# Patient Record
Sex: Male | Born: 1945 | Race: White | Hispanic: No | Marital: Married | Smoking: Never smoker
Health system: Southern US, Community
[De-identification: ages and names within clinical notes are randomized; demographics above are authoritative.]

## PROBLEM LIST (undated history)

## (undated) HISTORY — PX: FRACTURE SURGERY: SHX138

## (undated) HISTORY — PX: KNEE SURGERY: SHX244

## (undated) HISTORY — PX: HIP SURGERY: SHX245

## (undated) HISTORY — PX: NASAL SINUS SURGERY: SHX719

## (undated) HISTORY — PX: APPENDECTOMY: SHX54

## (undated) HISTORY — PX: TONSILLECTOMY: SUR1361

---

## 1979-07-25 HISTORY — PX: CARPAL TUNNEL RELEASE: SHX101

## 1999-09-27 ENCOUNTER — Ambulatory Visit (HOSPITAL_COMMUNITY): Admission: RE | Admit: 1999-09-27 | Discharge: 1999-09-27 | Payer: Self-pay | Admitting: Gastroenterology

## 2008-08-26 ENCOUNTER — Encounter: Admission: RE | Admit: 2008-08-26 | Discharge: 2008-08-26 | Payer: Self-pay | Admitting: Internal Medicine

## 2012-02-08 HISTORY — PX: HIP SURGERY: SHX245

## 2014-03-06 DIAGNOSIS — M5481 Occipital neuralgia: Secondary | ICD-10-CM

## 2014-03-06 DIAGNOSIS — M47812 Spondylosis without myelopathy or radiculopathy, cervical region: Secondary | ICD-10-CM | POA: Insufficient documentation

## 2014-03-06 HISTORY — DX: Spondylosis without myelopathy or radiculopathy, cervical region: M47.812

## 2014-03-06 HISTORY — DX: Occipital neuralgia: M54.81

## 2014-03-12 DIAGNOSIS — M25511 Pain in right shoulder: Secondary | ICD-10-CM

## 2014-03-12 HISTORY — DX: Pain in right shoulder: M25.511

## 2014-10-06 DIAGNOSIS — M2041 Other hammer toe(s) (acquired), right foot: Secondary | ICD-10-CM

## 2014-10-06 DIAGNOSIS — G8929 Other chronic pain: Secondary | ICD-10-CM | POA: Insufficient documentation

## 2014-10-06 DIAGNOSIS — M79671 Pain in right foot: Secondary | ICD-10-CM | POA: Diagnosis not present

## 2014-10-06 HISTORY — DX: Other hammer toe(s) (acquired), right foot: M20.41

## 2014-10-06 HISTORY — DX: Other chronic pain: G89.29

## 2014-11-07 DIAGNOSIS — I10 Essential (primary) hypertension: Secondary | ICD-10-CM | POA: Diagnosis not present

## 2014-11-07 DIAGNOSIS — Z1389 Encounter for screening for other disorder: Secondary | ICD-10-CM | POA: Diagnosis not present

## 2014-11-07 DIAGNOSIS — E785 Hyperlipidemia, unspecified: Secondary | ICD-10-CM | POA: Diagnosis not present

## 2014-11-07 DIAGNOSIS — Z9181 History of falling: Secondary | ICD-10-CM | POA: Diagnosis not present

## 2014-11-07 DIAGNOSIS — Z23 Encounter for immunization: Secondary | ICD-10-CM | POA: Diagnosis not present

## 2014-11-07 DIAGNOSIS — E559 Vitamin D deficiency, unspecified: Secondary | ICD-10-CM | POA: Diagnosis not present

## 2014-11-07 DIAGNOSIS — Z0000 Encounter for general adult medical examination without abnormal findings: Secondary | ICD-10-CM | POA: Diagnosis not present

## 2014-12-01 DIAGNOSIS — D122 Benign neoplasm of ascending colon: Secondary | ICD-10-CM | POA: Diagnosis not present

## 2014-12-01 DIAGNOSIS — K5730 Diverticulosis of large intestine without perforation or abscess without bleeding: Secondary | ICD-10-CM | POA: Diagnosis not present

## 2014-12-01 DIAGNOSIS — Z09 Encounter for follow-up examination after completed treatment for conditions other than malignant neoplasm: Secondary | ICD-10-CM | POA: Diagnosis not present

## 2014-12-01 DIAGNOSIS — Z86010 Personal history of colonic polyps: Secondary | ICD-10-CM | POA: Diagnosis not present

## 2014-12-01 DIAGNOSIS — D123 Benign neoplasm of transverse colon: Secondary | ICD-10-CM | POA: Diagnosis not present

## 2014-12-01 DIAGNOSIS — D126 Benign neoplasm of colon, unspecified: Secondary | ICD-10-CM | POA: Diagnosis not present

## 2015-01-19 DIAGNOSIS — M25559 Pain in unspecified hip: Secondary | ICD-10-CM | POA: Diagnosis not present

## 2015-01-19 DIAGNOSIS — S7002XA Contusion of left hip, initial encounter: Secondary | ICD-10-CM | POA: Diagnosis not present

## 2015-01-19 DIAGNOSIS — M25551 Pain in right hip: Secondary | ICD-10-CM

## 2015-01-19 HISTORY — DX: Pain in right hip: M25.551

## 2015-05-29 DIAGNOSIS — I10 Essential (primary) hypertension: Secondary | ICD-10-CM | POA: Diagnosis not present

## 2015-05-29 DIAGNOSIS — E559 Vitamin D deficiency, unspecified: Secondary | ICD-10-CM | POA: Diagnosis not present

## 2015-05-29 DIAGNOSIS — K219 Gastro-esophageal reflux disease without esophagitis: Secondary | ICD-10-CM | POA: Diagnosis not present

## 2015-05-29 DIAGNOSIS — Z125 Encounter for screening for malignant neoplasm of prostate: Secondary | ICD-10-CM | POA: Diagnosis not present

## 2015-05-29 DIAGNOSIS — Z23 Encounter for immunization: Secondary | ICD-10-CM | POA: Diagnosis not present

## 2015-05-29 DIAGNOSIS — E785 Hyperlipidemia, unspecified: Secondary | ICD-10-CM | POA: Diagnosis not present

## 2015-06-18 DIAGNOSIS — Z6834 Body mass index (BMI) 34.0-34.9, adult: Secondary | ICD-10-CM | POA: Diagnosis not present

## 2015-06-18 DIAGNOSIS — J019 Acute sinusitis, unspecified: Secondary | ICD-10-CM | POA: Diagnosis not present

## 2015-06-28 DIAGNOSIS — M5136 Other intervertebral disc degeneration, lumbar region: Secondary | ICD-10-CM | POA: Diagnosis not present

## 2015-06-28 DIAGNOSIS — M545 Low back pain: Secondary | ICD-10-CM | POA: Diagnosis not present

## 2015-06-28 DIAGNOSIS — M47816 Spondylosis without myelopathy or radiculopathy, lumbar region: Secondary | ICD-10-CM | POA: Diagnosis not present

## 2015-06-28 DIAGNOSIS — Z6834 Body mass index (BMI) 34.0-34.9, adult: Secondary | ICD-10-CM | POA: Diagnosis not present

## 2015-07-06 DIAGNOSIS — Z6834 Body mass index (BMI) 34.0-34.9, adult: Secondary | ICD-10-CM | POA: Diagnosis not present

## 2015-07-06 DIAGNOSIS — M545 Low back pain: Secondary | ICD-10-CM | POA: Diagnosis not present

## 2015-07-06 DIAGNOSIS — M25551 Pain in right hip: Secondary | ICD-10-CM | POA: Diagnosis not present

## 2015-07-15 ENCOUNTER — Encounter (HOSPITAL_COMMUNITY): Payer: Self-pay

## 2015-07-15 ENCOUNTER — Emergency Department (HOSPITAL_COMMUNITY): Payer: Non-veteran care

## 2015-07-15 ENCOUNTER — Emergency Department (HOSPITAL_COMMUNITY)
Admission: EM | Admit: 2015-07-15 | Discharge: 2015-07-15 | Disposition: A | Discharge status: Home / Self Care | Payer: Non-veteran care | Attending: Emergency Medicine | Admitting: Emergency Medicine

## 2015-07-15 DIAGNOSIS — Y9339 Activity, other involving climbing, rappelling and jumping off: Secondary | ICD-10-CM | POA: Insufficient documentation

## 2015-07-15 DIAGNOSIS — S4992XA Unspecified injury of left shoulder and upper arm, initial encounter: Secondary | ICD-10-CM | POA: Diagnosis present

## 2015-07-15 DIAGNOSIS — Y998 Other external cause status: Secondary | ICD-10-CM | POA: Diagnosis not present

## 2015-07-15 DIAGNOSIS — Y9289 Other specified places as the place of occurrence of the external cause: Secondary | ICD-10-CM | POA: Diagnosis not present

## 2015-07-15 DIAGNOSIS — W11XXXA Fall on and from ladder, initial encounter: Secondary | ICD-10-CM | POA: Diagnosis not present

## 2015-07-15 DIAGNOSIS — M79602 Pain in left arm: Secondary | ICD-10-CM | POA: Diagnosis not present

## 2015-07-15 DIAGNOSIS — S42392A Other fracture of shaft of left humerus, initial encounter for closed fracture: Secondary | ICD-10-CM | POA: Insufficient documentation

## 2015-07-15 DIAGNOSIS — S42302A Unspecified fracture of shaft of humerus, left arm, initial encounter for closed fracture: Secondary | ICD-10-CM

## 2015-07-15 DIAGNOSIS — S42322A Displaced transverse fracture of shaft of humerus, left arm, initial encounter for closed fracture: Secondary | ICD-10-CM | POA: Diagnosis not present

## 2015-07-15 DIAGNOSIS — T148 Other injury of unspecified body region: Secondary | ICD-10-CM | POA: Diagnosis not present

## 2015-07-15 MED ORDER — OXYCODONE-ACETAMINOPHEN 5-325 MG PO TABS: 1.0000 | ORAL_TABLET | ORAL | Status: DC | PRN

## 2015-07-15 MED ORDER — OXYCODONE-ACETAMINOPHEN 5-325 MG PO TABS
2.0000 | ORAL_TABLET | Freq: Once | ORAL | Status: AC
  Administered 2015-07-15: 2 via ORAL
  Filled 2015-07-15: qty 2

## 2015-07-15 MED ORDER — HYDROMORPHONE HCL 1 MG/ML IJ SOLN
1.0000 mg | Freq: Once | INTRAMUSCULAR | Status: AC
  Administered 2015-07-15: 1 mg via INTRAVENOUS
  Filled 2015-07-15: qty 1

## 2015-07-15 MED ORDER — FENTANYL CITRATE (PF) 100 MCG/2ML IJ SOLN
100.0000 ug | Freq: Once | INTRAMUSCULAR | Status: AC
  Administered 2015-07-15: 100 ug via INTRAVENOUS
  Filled 2015-07-15: qty 2

## 2015-07-15 NOTE — ED Provider Notes (Signed)
CSN: [id number]     Arrival date & time 07/15/15  1431 History   First MD Initiated Contact with Patient 07/15/15 1457     Chief Complaint  Patient presents with  . Arm Injury     (Consider location/radiation/quality/duration/timing/severity/associated sxs/prior Treatment) HPI  69 year old male presents after a fall off a ladder. He was estimated 8 feet high on the ladder when he was climbing down a ladder tipped over and he fell and landed on his left side. Did not hit his head and has no neck pain. Patient has left upper arm pain and feels numbness over the site of injury. He states he can feel his fingers and move his fingers. Rates his pain as severe.  History reviewed. No pertinent past medical history. Past Surgical History  Procedure Laterality Date  . Hip surgery     History reviewed. No pertinent family history. Social History  Substance Use Topics  . Smoking status: Never Smoker   . Smokeless tobacco: None  . Alcohol Use: Yes     Comment: socially    Review of Systems  Gastrointestinal: Negative for vomiting.  Musculoskeletal: Positive for arthralgias. Negative for neck pain.  Neurological: Positive for numbness. Negative for dizziness, weakness and headaches.  All other systems reviewed and are negative.     Allergies  Review of patient's allergies indicates no known allergies.  Home Medications   Prior to Admission medications   Not on File   BP 147/79 mmHg  Pulse 72  Temp(Src) 97.9 F (36.6 C) (Oral)  Resp 18  Ht 6' (1.829 m)  Wt 250 lb (113.399 kg)  BMI 33.90 kg/m2  SpO2 95% Physical Exam  Constitutional: He is oriented to person, place, and time. He appears well-developed and well-nourished.  HENT:  Head: Normocephalic and atraumatic.  Right Ear: External ear normal.  Left Ear: External ear normal.  Nose: Nose normal.  Eyes: Right eye exhibits no discharge. Left eye exhibits no discharge.  Neck: Neck supple.  Cardiovascular: Normal rate,  regular rhythm, normal heart sounds and intact distal pulses.   Pulses:      Radial pulses are 2+ on the right side, and 2+ on the left side.  Pulmonary/Chest: Effort normal and breath sounds normal.  Abdominal: Soft. He exhibits no distension. There is no tenderness.  Musculoskeletal: He exhibits no edema.       Left shoulder: He exhibits no tenderness.       Left elbow: No tenderness found.       Left upper arm: He exhibits tenderness, swelling and deformity.  Neurological: He is alert and oriented to person, place, and time.  Skin: Skin is warm and dry.  Nursing note and vitals reviewed.   ED Course  Procedures (including critical care time) Labs Review Labs Reviewed - No data to display  Imaging Review Dg Shoulder Left  07/15/2015  CLINICAL DATA:  Left humeral deformity status post fall from a ladder. EXAM: LEFT SHOULDER - 2+ VIEW COMPARISON:  None. FINDINGS: There is a transverse fracture through the proximal 1/3 of the left humerus with 17 mm overlap of the fracture fragments, and 1/2 shaft medial displacement of the distal fracture fragment. There is an associated soft tissue swelling. The left glenohumeral joint appears located. The visualized portions of the lung are normal. IMPRESSION: Transverse fracture of the proximal 1/3 of the left humeral shaft. Electronically Signed   By: Fidela Salisbury M.D.   On: 07/15/2015 16:03   Dg Humerus Left  07/15/2015  CLINICAL DATA:  [place] from ladder approximately 12 feet. Left arm pain and deformity. EXAM: LEFT HUMERUS - 2+ VIEW COMPARISON:  None. FINDINGS: There is a transverse midshaft fracture of the left humerus. Fractures non comminuted. Fracture is displaced, with the distal fracture component displaced lateral to the proximal fracture component by 1 full shaft width. Fracture is also overlapped by approximate 2.5 cm. No other fractures. Shoulder and elbow joints appear normally aligned. IMPRESSION: Displaced, non comminuted fracture  of the midshaft of the left humerus. Electronically Signed   By: Lajean Manes M.D.   On: 07/15/2015 16:01   I have personally reviewed and evaluated these images and lab results as part of my medical decision-making.   EKG Interpretation None      MDM   Final diagnoses:  Humerus shaft fracture, left, closed, initial encounter    Patient is neurovascularly intact. He asked he feels better after the x-ray, it appears that his deformity is less pronounced and likely during the x-rays his arm was pulled down a little. Remains neurovascularly intact. Pain better controlled with IV pain medicine. He feels comfortable going home and will be given oral Percocet. I discussed with Dr. Rush Farmer of orthopedic surgery who recommends a coaptation splint and sling. Patient is to call the office next morning for close follow-up.    Sherwood Gambler, MD 07/16/15 [phone]

## 2015-07-15 NOTE — ED Notes (Signed)
Pt brought in EMS after a fall from an 52ft ladder.  Pt denies LOC, neck or back pain.  Pt reports he landed on his left arm and has obvious deformity to the left upper arm.  Pt is A&Ox4 upon arrival.

## 2015-07-15 NOTE — Progress Notes (Signed)
Orthopedic Tech Progress Note Patient Details:  IMMANUEL LANGRIDGE Oct 21, 1945 [id number]  Ortho Devices Type of Ortho Device: Ace wrap, Arm sling, Coapt, Post (long arm) splint Ortho Device/Splint Location: LUE Ortho Device/Splint Interventions: Ordered, Application   Braulio Bosch 07/15/2015, 5:46 PM

## 2015-07-15 NOTE — ED Notes (Signed)
EDP at bedside  

## 2015-07-20 DIAGNOSIS — M25512 Pain in left shoulder: Secondary | ICD-10-CM

## 2015-07-20 HISTORY — DX: Pain in left shoulder: M25.512

## 2015-07-24 DIAGNOSIS — M898X2 Other specified disorders of bone, upper arm: Secondary | ICD-10-CM

## 2015-07-24 DIAGNOSIS — M79622 Pain in left upper arm: Secondary | ICD-10-CM | POA: Diagnosis not present

## 2015-07-24 HISTORY — DX: Other specified disorders of bone, upper arm: M89.8X2

## 2015-08-05 DIAGNOSIS — M79622 Pain in left upper arm: Secondary | ICD-10-CM | POA: Diagnosis not present

## 2015-08-06 DIAGNOSIS — S42302A Unspecified fracture of shaft of humerus, left arm, initial encounter for closed fracture: Secondary | ICD-10-CM | POA: Diagnosis not present

## 2015-08-20 DIAGNOSIS — M79622 Pain in left upper arm: Secondary | ICD-10-CM | POA: Diagnosis not present

## 2015-09-09 DIAGNOSIS — M25522 Pain in left elbow: Secondary | ICD-10-CM

## 2015-09-09 DIAGNOSIS — M79622 Pain in left upper arm: Secondary | ICD-10-CM | POA: Diagnosis not present

## 2015-09-09 HISTORY — DX: Pain in left elbow: M25.522

## 2015-10-14 DIAGNOSIS — M25512 Pain in left shoulder: Secondary | ICD-10-CM | POA: Diagnosis not present

## 2015-10-18 DIAGNOSIS — S42309A Unspecified fracture of shaft of humerus, unspecified arm, initial encounter for closed fracture: Secondary | ICD-10-CM | POA: Insufficient documentation

## 2015-10-18 HISTORY — DX: Unspecified fracture of shaft of humerus, unspecified arm, initial encounter for closed fracture: S42.309A

## 2015-10-21 DIAGNOSIS — E663 Overweight: Secondary | ICD-10-CM

## 2015-10-21 DIAGNOSIS — M25512 Pain in left shoulder: Secondary | ICD-10-CM | POA: Diagnosis not present

## 2015-10-21 DIAGNOSIS — S42322S Displaced transverse fracture of shaft of humerus, left arm, sequela: Secondary | ICD-10-CM | POA: Diagnosis not present

## 2015-10-21 HISTORY — DX: Overweight: E66.3

## 2015-11-11 DIAGNOSIS — M79622 Pain in left upper arm: Secondary | ICD-10-CM | POA: Diagnosis not present

## 2015-11-27 DIAGNOSIS — K219 Gastro-esophageal reflux disease without esophagitis: Secondary | ICD-10-CM | POA: Diagnosis not present

## 2015-11-27 DIAGNOSIS — F514 Sleep terrors [night terrors]: Secondary | ICD-10-CM | POA: Diagnosis not present

## 2015-11-27 DIAGNOSIS — E559 Vitamin D deficiency, unspecified: Secondary | ICD-10-CM | POA: Diagnosis not present

## 2015-11-27 DIAGNOSIS — I10 Essential (primary) hypertension: Secondary | ICD-10-CM | POA: Diagnosis not present

## 2015-11-27 DIAGNOSIS — E785 Hyperlipidemia, unspecified: Secondary | ICD-10-CM | POA: Diagnosis not present

## 2015-12-16 DIAGNOSIS — S42322S Displaced transverse fracture of shaft of humerus, left arm, sequela: Secondary | ICD-10-CM | POA: Diagnosis not present

## 2015-12-16 DIAGNOSIS — M79622 Pain in left upper arm: Secondary | ICD-10-CM | POA: Diagnosis not present

## 2015-12-21 DIAGNOSIS — R140 Abdominal distension (gaseous): Secondary | ICD-10-CM | POA: Diagnosis not present

## 2016-01-26 DIAGNOSIS — M25552 Pain in left hip: Secondary | ICD-10-CM | POA: Diagnosis not present

## 2016-02-01 DIAGNOSIS — S4992XD Unspecified injury of left shoulder and upper arm, subsequent encounter: Secondary | ICD-10-CM | POA: Diagnosis not present

## 2016-02-01 DIAGNOSIS — S42302A Unspecified fracture of shaft of humerus, left arm, initial encounter for closed fracture: Secondary | ICD-10-CM | POA: Diagnosis not present

## 2016-02-01 DIAGNOSIS — R9431 Abnormal electrocardiogram [ECG] [EKG]: Secondary | ICD-10-CM | POA: Diagnosis not present

## 2016-02-01 DIAGNOSIS — R001 Bradycardia, unspecified: Secondary | ICD-10-CM | POA: Diagnosis not present

## 2016-02-03 DIAGNOSIS — M79622 Pain in left upper arm: Secondary | ICD-10-CM | POA: Diagnosis not present

## 2016-02-03 DIAGNOSIS — M25512 Pain in left shoulder: Secondary | ICD-10-CM | POA: Diagnosis not present

## 2016-02-04 DIAGNOSIS — M79602 Pain in left arm: Secondary | ICD-10-CM | POA: Diagnosis not present

## 2016-02-04 DIAGNOSIS — S42392A Other fracture of shaft of left humerus, initial encounter for closed fracture: Secondary | ICD-10-CM | POA: Diagnosis not present

## 2016-02-04 DIAGNOSIS — G8918 Other acute postprocedural pain: Secondary | ICD-10-CM | POA: Diagnosis not present

## 2016-02-04 DIAGNOSIS — Z79899 Other long term (current) drug therapy: Secondary | ICD-10-CM | POA: Diagnosis not present

## 2016-02-04 DIAGNOSIS — S42302A Unspecified fracture of shaft of humerus, left arm, initial encounter for closed fracture: Secondary | ICD-10-CM | POA: Diagnosis not present

## 2016-02-04 DIAGNOSIS — S42322K Displaced transverse fracture of shaft of humerus, left arm, subsequent encounter for fracture with nonunion: Secondary | ICD-10-CM | POA: Diagnosis not present

## 2016-02-05 DIAGNOSIS — M79602 Pain in left arm: Secondary | ICD-10-CM | POA: Diagnosis not present

## 2016-02-05 DIAGNOSIS — Z79899 Other long term (current) drug therapy: Secondary | ICD-10-CM | POA: Diagnosis not present

## 2016-02-05 DIAGNOSIS — S42302A Unspecified fracture of shaft of humerus, left arm, initial encounter for closed fracture: Secondary | ICD-10-CM | POA: Diagnosis not present

## 2016-02-08 DIAGNOSIS — S4992XD Unspecified injury of left shoulder and upper arm, subsequent encounter: Secondary | ICD-10-CM | POA: Diagnosis not present

## 2016-02-08 DIAGNOSIS — R600 Localized edema: Secondary | ICD-10-CM | POA: Diagnosis not present

## 2016-02-09 DIAGNOSIS — M7989 Other specified soft tissue disorders: Secondary | ICD-10-CM | POA: Diagnosis not present

## 2016-02-09 DIAGNOSIS — S4992XD Unspecified injury of left shoulder and upper arm, subsequent encounter: Secondary | ICD-10-CM | POA: Diagnosis not present

## 2016-02-24 DIAGNOSIS — S42302A Unspecified fracture of shaft of humerus, left arm, initial encounter for closed fracture: Secondary | ICD-10-CM | POA: Diagnosis not present

## 2016-02-27 IMAGING — CR DG SHOULDER 2+V*L*
2 series · 2 of 2 positions shown · non-contrast
Comparison: None.

CLINICAL DATA: Left humeral deformity status post fall from a
ladder.

EXAM:
LEFT SHOULDER - 2+ VIEW

[shoulder ap neutral]
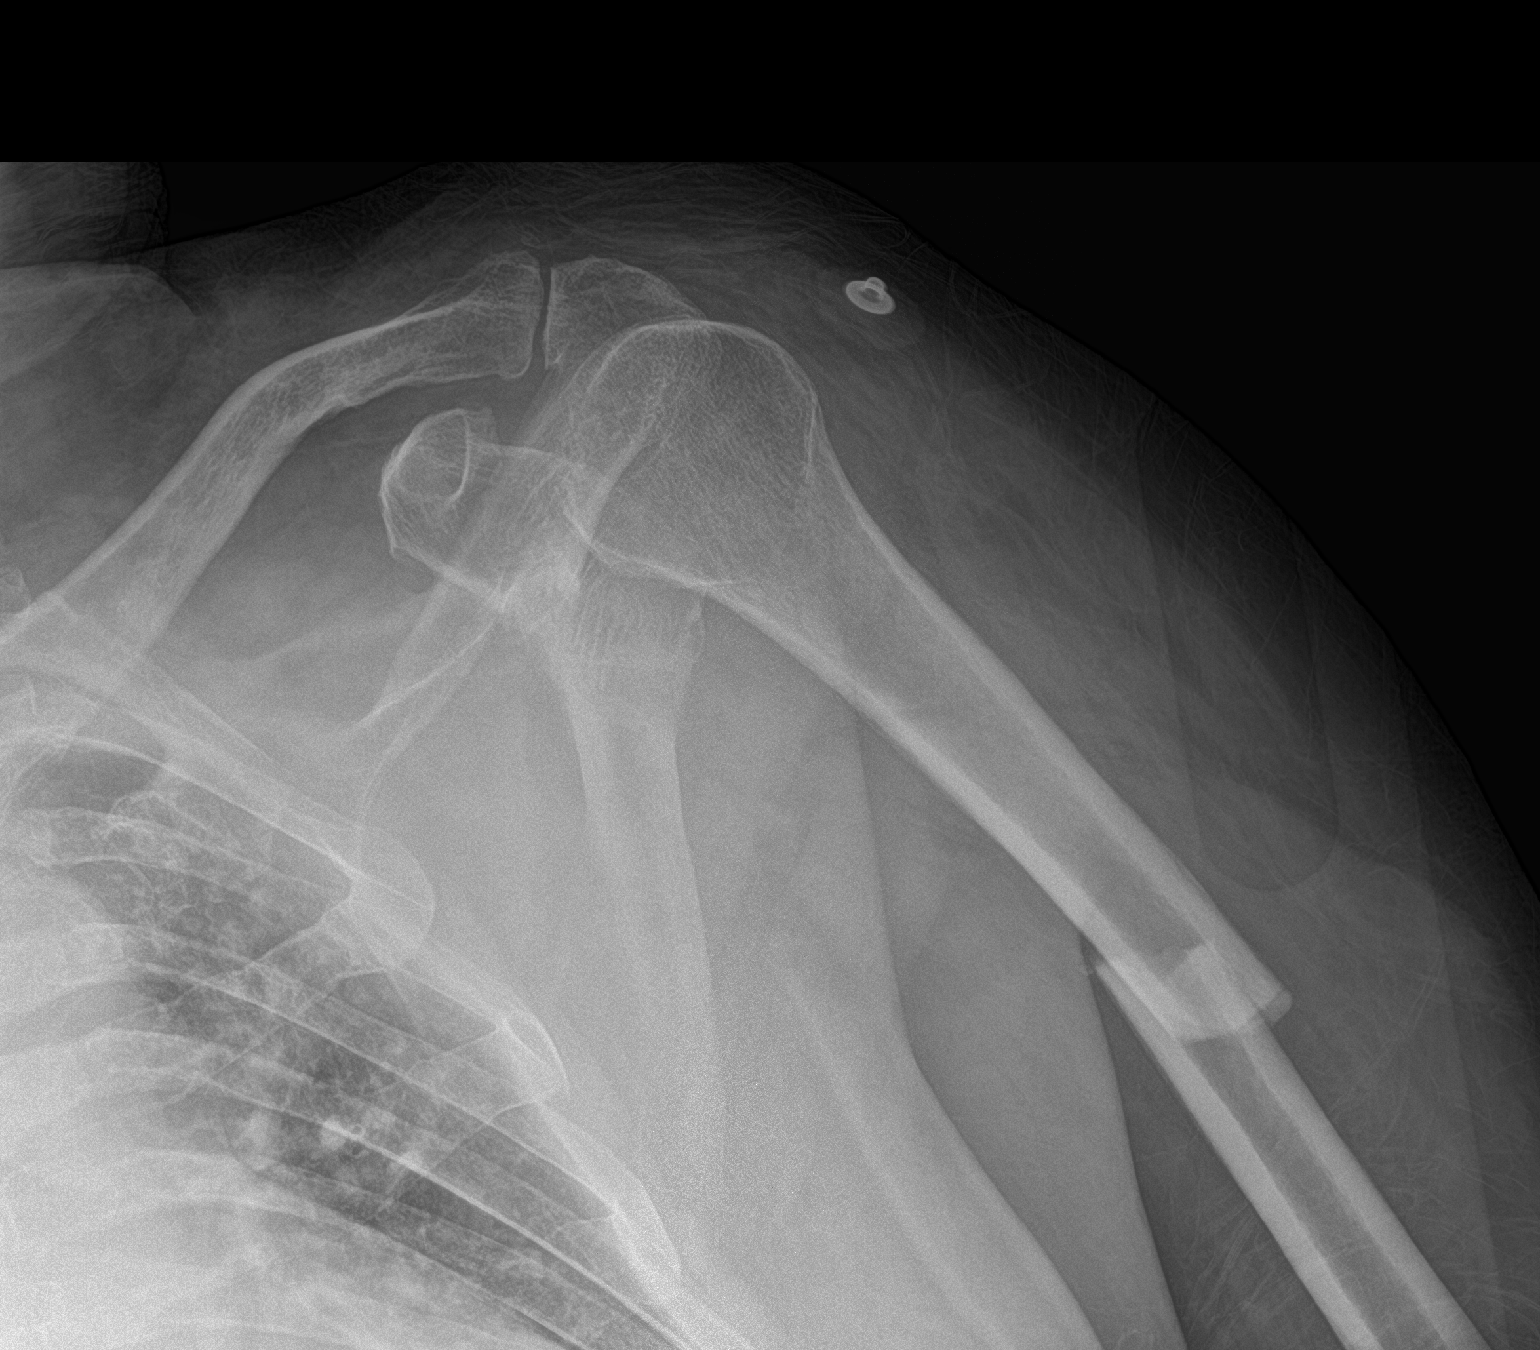

[shoulder tsy view]
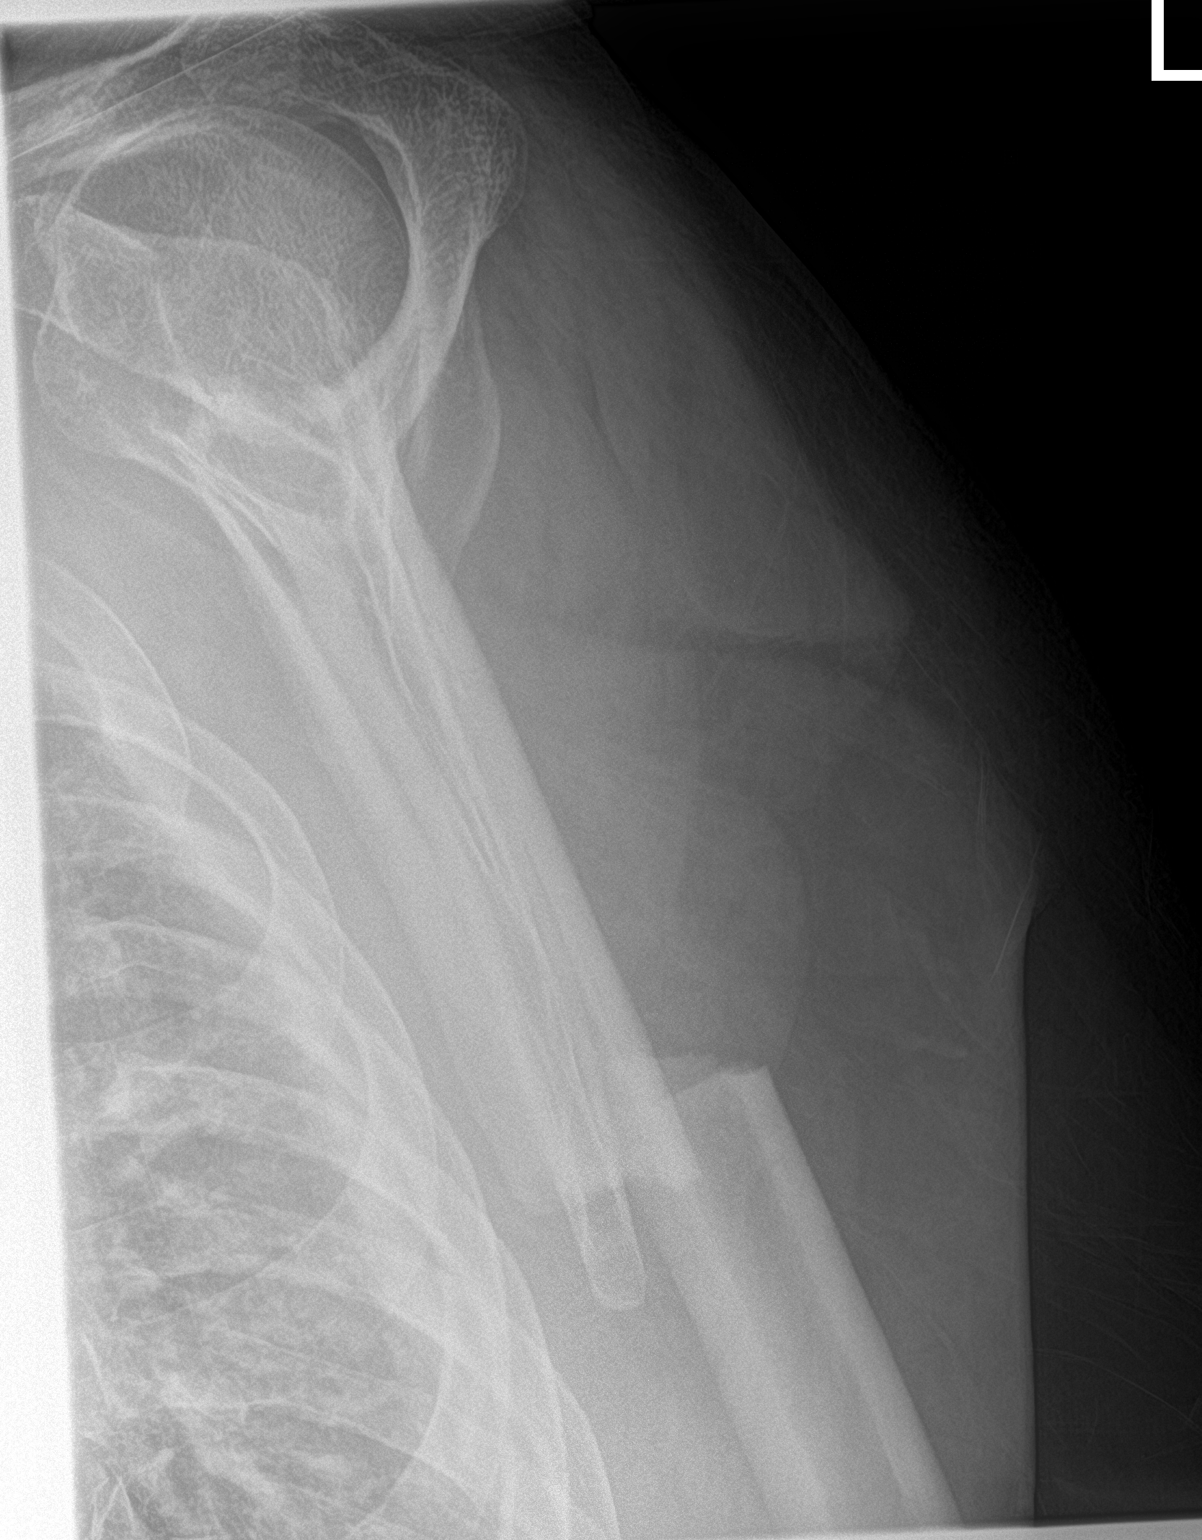

[2 of 2 positions shown; findings below may reference images not displayed]

FINDINGS: There is a transverse fracture through the proximal [DATE] of the left
humerus with 17 mm overlap of the fracture fragments, and [DATE] shaft
medial displacement of the distal fracture fragment. There is an
associated soft tissue swelling. The left glenohumeral joint appears
located. The visualized portions of the lung are normal.
IMPRESSION: Transverse fracture of the proximal [DATE] of the left humeral shaft.

## 2016-03-16 DIAGNOSIS — Z4789 Encounter for other orthopedic aftercare: Secondary | ICD-10-CM | POA: Diagnosis not present

## 2016-04-13 DIAGNOSIS — S42302A Unspecified fracture of shaft of humerus, left arm, initial encounter for closed fracture: Secondary | ICD-10-CM | POA: Diagnosis not present

## 2016-04-13 DIAGNOSIS — M7502 Adhesive capsulitis of left shoulder: Secondary | ICD-10-CM | POA: Diagnosis not present

## 2016-04-13 DIAGNOSIS — S42302D Unspecified fracture of shaft of humerus, left arm, subsequent encounter for fracture with routine healing: Secondary | ICD-10-CM | POA: Diagnosis not present

## 2016-04-20 DIAGNOSIS — T22011A Burn of unspecified degree of right forearm, initial encounter: Secondary | ICD-10-CM | POA: Diagnosis not present

## 2016-04-20 DIAGNOSIS — Z9181 History of falling: Secondary | ICD-10-CM | POA: Diagnosis not present

## 2016-04-20 DIAGNOSIS — Z1389 Encounter for screening for other disorder: Secondary | ICD-10-CM | POA: Diagnosis not present

## 2016-04-20 DIAGNOSIS — Z23 Encounter for immunization: Secondary | ICD-10-CM | POA: Diagnosis not present

## 2016-05-22 DIAGNOSIS — M4317 Spondylolisthesis, lumbosacral region: Secondary | ICD-10-CM | POA: Diagnosis not present

## 2016-05-22 DIAGNOSIS — M5136 Other intervertebral disc degeneration, lumbar region: Secondary | ICD-10-CM | POA: Diagnosis not present

## 2016-05-22 DIAGNOSIS — M545 Low back pain: Secondary | ICD-10-CM | POA: Diagnosis not present

## 2016-05-22 DIAGNOSIS — Z23 Encounter for immunization: Secondary | ICD-10-CM | POA: Diagnosis not present

## 2016-05-24 DIAGNOSIS — S42302D Unspecified fracture of shaft of humerus, left arm, subsequent encounter for fracture with routine healing: Secondary | ICD-10-CM | POA: Diagnosis not present

## 2016-05-24 DIAGNOSIS — M545 Low back pain: Secondary | ICD-10-CM | POA: Diagnosis not present

## 2016-05-24 DIAGNOSIS — S42302A Unspecified fracture of shaft of humerus, left arm, initial encounter for closed fracture: Secondary | ICD-10-CM | POA: Diagnosis not present

## 2016-05-30 DIAGNOSIS — M549 Dorsalgia, unspecified: Secondary | ICD-10-CM | POA: Diagnosis not present

## 2016-05-30 DIAGNOSIS — M4316 Spondylolisthesis, lumbar region: Secondary | ICD-10-CM | POA: Diagnosis not present

## 2016-06-03 DIAGNOSIS — E559 Vitamin D deficiency, unspecified: Secondary | ICD-10-CM | POA: Diagnosis not present

## 2016-06-03 DIAGNOSIS — I10 Essential (primary) hypertension: Secondary | ICD-10-CM | POA: Diagnosis not present

## 2016-06-03 DIAGNOSIS — Z125 Encounter for screening for malignant neoplasm of prostate: Secondary | ICD-10-CM | POA: Diagnosis not present

## 2016-06-03 DIAGNOSIS — E785 Hyperlipidemia, unspecified: Secondary | ICD-10-CM | POA: Diagnosis not present

## 2016-06-03 DIAGNOSIS — K219 Gastro-esophageal reflux disease without esophagitis: Secondary | ICD-10-CM | POA: Diagnosis not present

## 2016-06-05 DIAGNOSIS — M545 Low back pain: Secondary | ICD-10-CM | POA: Diagnosis not present

## 2016-06-05 DIAGNOSIS — M4316 Spondylolisthesis, lumbar region: Secondary | ICD-10-CM | POA: Diagnosis not present

## 2016-06-09 DIAGNOSIS — M545 Low back pain: Secondary | ICD-10-CM | POA: Diagnosis not present

## 2016-06-09 DIAGNOSIS — M4316 Spondylolisthesis, lumbar region: Secondary | ICD-10-CM | POA: Diagnosis not present

## 2016-06-12 DIAGNOSIS — M4316 Spondylolisthesis, lumbar region: Secondary | ICD-10-CM | POA: Diagnosis not present

## 2016-06-12 DIAGNOSIS — M545 Low back pain: Secondary | ICD-10-CM | POA: Diagnosis not present

## 2016-06-14 DIAGNOSIS — M4316 Spondylolisthesis, lumbar region: Secondary | ICD-10-CM | POA: Diagnosis not present

## 2016-06-14 DIAGNOSIS — M545 Low back pain: Secondary | ICD-10-CM | POA: Diagnosis not present

## 2016-07-10 DIAGNOSIS — I4891 Unspecified atrial fibrillation: Secondary | ICD-10-CM

## 2016-07-10 DIAGNOSIS — F17201 Nicotine dependence, unspecified, in remission: Secondary | ICD-10-CM | POA: Insufficient documentation

## 2016-07-10 HISTORY — DX: Unspecified atrial fibrillation: I48.91

## 2016-07-10 HISTORY — DX: Nicotine dependence, unspecified, in remission: F17.201

## 2016-07-10 HISTORY — PX: PARS PLANA VITRECTOMY: SHX2166

## 2016-07-11 DIAGNOSIS — Z9889 Other specified postprocedural states: Secondary | ICD-10-CM | POA: Insufficient documentation

## 2016-07-11 HISTORY — DX: Other specified postprocedural states: Z98.890

## 2016-07-18 DIAGNOSIS — I4891 Unspecified atrial fibrillation: Secondary | ICD-10-CM | POA: Diagnosis not present

## 2016-07-18 DIAGNOSIS — Z79899 Other long term (current) drug therapy: Secondary | ICD-10-CM | POA: Diagnosis not present

## 2016-08-07 DIAGNOSIS — Z7901 Long term (current) use of anticoagulants: Secondary | ICD-10-CM | POA: Insufficient documentation

## 2016-08-07 DIAGNOSIS — S4992XD Unspecified injury of left shoulder and upper arm, subsequent encounter: Secondary | ICD-10-CM | POA: Diagnosis not present

## 2016-08-07 DIAGNOSIS — S42302A Unspecified fracture of shaft of humerus, left arm, initial encounter for closed fracture: Secondary | ICD-10-CM | POA: Diagnosis not present

## 2016-08-07 HISTORY — DX: Long term (current) use of anticoagulants: Z79.01

## 2016-08-08 DIAGNOSIS — I481 Persistent atrial fibrillation: Secondary | ICD-10-CM | POA: Diagnosis not present

## 2016-08-08 DIAGNOSIS — Z7901 Long term (current) use of anticoagulants: Secondary | ICD-10-CM | POA: Diagnosis not present

## 2016-08-08 DIAGNOSIS — I4891 Unspecified atrial fibrillation: Secondary | ICD-10-CM | POA: Diagnosis not present

## 2016-08-09 DIAGNOSIS — S82831A Other fracture of upper and lower end of right fibula, initial encounter for closed fracture: Secondary | ICD-10-CM | POA: Diagnosis not present

## 2016-08-09 DIAGNOSIS — R918 Other nonspecific abnormal finding of lung field: Secondary | ICD-10-CM | POA: Diagnosis not present

## 2016-08-09 DIAGNOSIS — S82851A Displaced trimalleolar fracture of right lower leg, initial encounter for closed fracture: Secondary | ICD-10-CM | POA: Diagnosis not present

## 2016-08-09 DIAGNOSIS — S82891A Other fracture of right lower leg, initial encounter for closed fracture: Secondary | ICD-10-CM | POA: Diagnosis not present

## 2016-08-15 DIAGNOSIS — I481 Persistent atrial fibrillation: Secondary | ICD-10-CM | POA: Diagnosis not present

## 2016-08-15 DIAGNOSIS — I4891 Unspecified atrial fibrillation: Secondary | ICD-10-CM | POA: Diagnosis not present

## 2016-08-15 HISTORY — PX: CARDIOVERSION: SHX1299

## 2016-08-16 DIAGNOSIS — S82851A Displaced trimalleolar fracture of right lower leg, initial encounter for closed fracture: Secondary | ICD-10-CM | POA: Diagnosis not present

## 2016-08-18 DIAGNOSIS — I4891 Unspecified atrial fibrillation: Secondary | ICD-10-CM | POA: Diagnosis not present

## 2016-08-21 DIAGNOSIS — I481 Persistent atrial fibrillation: Secondary | ICD-10-CM | POA: Diagnosis not present

## 2016-08-21 DIAGNOSIS — Z7901 Long term (current) use of anticoagulants: Secondary | ICD-10-CM | POA: Diagnosis not present

## 2016-08-23 DIAGNOSIS — Z7901 Long term (current) use of anticoagulants: Secondary | ICD-10-CM | POA: Diagnosis not present

## 2016-08-23 DIAGNOSIS — I481 Persistent atrial fibrillation: Secondary | ICD-10-CM | POA: Diagnosis not present

## 2016-08-29 DIAGNOSIS — Z87891 Personal history of nicotine dependence: Secondary | ICD-10-CM | POA: Diagnosis not present

## 2016-08-29 DIAGNOSIS — Z8679 Personal history of other diseases of the circulatory system: Secondary | ICD-10-CM | POA: Diagnosis not present

## 2016-08-29 DIAGNOSIS — S82841A Displaced bimalleolar fracture of right lower leg, initial encounter for closed fracture: Secondary | ICD-10-CM | POA: Diagnosis not present

## 2016-08-29 DIAGNOSIS — S8261XA Displaced fracture of lateral malleolus of right fibula, initial encounter for closed fracture: Secondary | ICD-10-CM | POA: Diagnosis not present

## 2016-08-29 DIAGNOSIS — Z7901 Long term (current) use of anticoagulants: Secondary | ICD-10-CM | POA: Diagnosis not present

## 2016-08-29 HISTORY — PX: ANKLE SURGERY: SHX546

## 2016-09-11 DIAGNOSIS — S82851A Displaced trimalleolar fracture of right lower leg, initial encounter for closed fracture: Secondary | ICD-10-CM | POA: Diagnosis not present

## 2016-09-21 DIAGNOSIS — I480 Paroxysmal atrial fibrillation: Secondary | ICD-10-CM | POA: Diagnosis not present

## 2016-09-21 DIAGNOSIS — Z79899 Other long term (current) drug therapy: Secondary | ICD-10-CM | POA: Diagnosis not present

## 2016-09-21 DIAGNOSIS — Z7901 Long term (current) use of anticoagulants: Secondary | ICD-10-CM | POA: Diagnosis not present

## 2016-09-29 DIAGNOSIS — I480 Paroxysmal atrial fibrillation: Secondary | ICD-10-CM | POA: Diagnosis not present

## 2016-10-02 DIAGNOSIS — S42302A Unspecified fracture of shaft of humerus, left arm, initial encounter for closed fracture: Secondary | ICD-10-CM | POA: Diagnosis not present

## 2016-10-02 DIAGNOSIS — S4992XD Unspecified injury of left shoulder and upper arm, subsequent encounter: Secondary | ICD-10-CM | POA: Diagnosis not present

## 2016-10-04 DIAGNOSIS — I480 Paroxysmal atrial fibrillation: Secondary | ICD-10-CM | POA: Diagnosis not present

## 2016-10-05 DIAGNOSIS — R001 Bradycardia, unspecified: Secondary | ICD-10-CM | POA: Diagnosis not present

## 2016-10-09 DIAGNOSIS — S82851A Displaced trimalleolar fracture of right lower leg, initial encounter for closed fracture: Secondary | ICD-10-CM | POA: Diagnosis not present

## 2016-10-30 DIAGNOSIS — S82851A Displaced trimalleolar fracture of right lower leg, initial encounter for closed fracture: Secondary | ICD-10-CM | POA: Diagnosis not present

## 2016-10-31 DIAGNOSIS — H25013 Cortical age-related cataract, bilateral: Secondary | ICD-10-CM | POA: Diagnosis not present

## 2016-10-31 DIAGNOSIS — H2511 Age-related nuclear cataract, right eye: Secondary | ICD-10-CM | POA: Diagnosis not present

## 2016-10-31 DIAGNOSIS — H2513 Age-related nuclear cataract, bilateral: Secondary | ICD-10-CM | POA: Diagnosis not present

## 2016-10-31 DIAGNOSIS — H40003 Preglaucoma, unspecified, bilateral: Secondary | ICD-10-CM | POA: Diagnosis not present

## 2016-10-31 DIAGNOSIS — H02839 Dermatochalasis of unspecified eye, unspecified eyelid: Secondary | ICD-10-CM | POA: Diagnosis not present

## 2016-10-31 DIAGNOSIS — H18413 Arcus senilis, bilateral: Secondary | ICD-10-CM | POA: Diagnosis not present

## 2016-11-02 DIAGNOSIS — Z7901 Long term (current) use of anticoagulants: Secondary | ICD-10-CM | POA: Diagnosis not present

## 2016-11-02 DIAGNOSIS — R001 Bradycardia, unspecified: Secondary | ICD-10-CM | POA: Diagnosis not present

## 2016-11-02 DIAGNOSIS — I480 Paroxysmal atrial fibrillation: Secondary | ICD-10-CM | POA: Diagnosis not present

## 2016-11-03 DIAGNOSIS — M25671 Stiffness of right ankle, not elsewhere classified: Secondary | ICD-10-CM | POA: Diagnosis not present

## 2016-11-03 DIAGNOSIS — M25571 Pain in right ankle and joints of right foot: Secondary | ICD-10-CM | POA: Diagnosis not present

## 2016-11-03 DIAGNOSIS — R2689 Other abnormalities of gait and mobility: Secondary | ICD-10-CM | POA: Diagnosis not present

## 2016-11-03 DIAGNOSIS — S82851D Displaced trimalleolar fracture of right lower leg, subsequent encounter for closed fracture with routine healing: Secondary | ICD-10-CM | POA: Diagnosis not present

## 2016-11-24 DIAGNOSIS — H2511 Age-related nuclear cataract, right eye: Secondary | ICD-10-CM | POA: Diagnosis not present

## 2016-11-24 DIAGNOSIS — H25811 Combined forms of age-related cataract, right eye: Secondary | ICD-10-CM | POA: Diagnosis not present

## 2016-11-24 DIAGNOSIS — H2512 Age-related nuclear cataract, left eye: Secondary | ICD-10-CM | POA: Diagnosis not present

## 2016-11-24 HISTORY — PX: CATARACT EXTRACTION: SUR2

## 2016-11-29 DIAGNOSIS — M25571 Pain in right ankle and joints of right foot: Secondary | ICD-10-CM | POA: Diagnosis not present

## 2016-11-29 DIAGNOSIS — R2689 Other abnormalities of gait and mobility: Secondary | ICD-10-CM | POA: Diagnosis not present

## 2016-11-29 DIAGNOSIS — S82851D Displaced trimalleolar fracture of right lower leg, subsequent encounter for closed fracture with routine healing: Secondary | ICD-10-CM | POA: Diagnosis not present

## 2016-11-29 DIAGNOSIS — M25671 Stiffness of right ankle, not elsewhere classified: Secondary | ICD-10-CM | POA: Diagnosis not present

## 2016-12-06 DIAGNOSIS — L02414 Cutaneous abscess of left upper limb: Secondary | ICD-10-CM | POA: Diagnosis not present

## 2016-12-29 DIAGNOSIS — Z7901 Long term (current) use of anticoagulants: Secondary | ICD-10-CM | POA: Diagnosis not present

## 2016-12-29 DIAGNOSIS — E7800 Pure hypercholesterolemia, unspecified: Secondary | ICD-10-CM | POA: Diagnosis not present

## 2016-12-29 DIAGNOSIS — R001 Bradycardia, unspecified: Secondary | ICD-10-CM

## 2016-12-29 DIAGNOSIS — I480 Paroxysmal atrial fibrillation: Secondary | ICD-10-CM | POA: Diagnosis not present

## 2016-12-29 HISTORY — DX: Bradycardia, unspecified: R00.1

## 2017-02-08 DIAGNOSIS — T1501XA Foreign body in cornea, right eye, initial encounter: Secondary | ICD-10-CM | POA: Diagnosis not present

## 2017-02-12 DIAGNOSIS — S0501XD Injury of conjunctiva and corneal abrasion without foreign body, right eye, subsequent encounter: Secondary | ICD-10-CM | POA: Diagnosis not present

## 2017-02-15 DIAGNOSIS — I480 Paroxysmal atrial fibrillation: Secondary | ICD-10-CM | POA: Diagnosis not present

## 2017-04-23 DIAGNOSIS — R197 Diarrhea, unspecified: Secondary | ICD-10-CM | POA: Diagnosis not present

## 2017-04-24 DIAGNOSIS — R197 Diarrhea, unspecified: Secondary | ICD-10-CM | POA: Diagnosis not present

## 2017-05-28 DIAGNOSIS — I714 Abdominal aortic aneurysm, without rupture: Secondary | ICD-10-CM | POA: Diagnosis not present

## 2017-05-28 DIAGNOSIS — I719 Aortic aneurysm of unspecified site, without rupture: Secondary | ICD-10-CM | POA: Diagnosis not present

## 2017-05-28 DIAGNOSIS — R7989 Other specified abnormal findings of blood chemistry: Secondary | ICD-10-CM | POA: Diagnosis not present

## 2017-05-28 DIAGNOSIS — I712 Thoracic aortic aneurysm, without rupture: Secondary | ICD-10-CM | POA: Diagnosis not present

## 2017-05-28 DIAGNOSIS — S2242XA Multiple fractures of ribs, left side, initial encounter for closed fracture: Secondary | ICD-10-CM | POA: Diagnosis not present

## 2017-05-28 DIAGNOSIS — K5790 Diverticulosis of intestine, part unspecified, without perforation or abscess without bleeding: Secondary | ICD-10-CM | POA: Diagnosis not present

## 2017-06-09 DIAGNOSIS — Z125 Encounter for screening for malignant neoplasm of prostate: Secondary | ICD-10-CM | POA: Diagnosis not present

## 2017-06-09 DIAGNOSIS — E785 Hyperlipidemia, unspecified: Secondary | ICD-10-CM | POA: Diagnosis not present

## 2017-07-05 DIAGNOSIS — M25511 Pain in right shoulder: Secondary | ICD-10-CM | POA: Diagnosis not present

## 2017-09-17 DIAGNOSIS — S42302A Unspecified fracture of shaft of humerus, left arm, initial encounter for closed fracture: Secondary | ICD-10-CM | POA: Diagnosis not present

## 2017-09-17 DIAGNOSIS — M75111 Incomplete rotator cuff tear or rupture of right shoulder, not specified as traumatic: Secondary | ICD-10-CM | POA: Diagnosis not present

## 2017-09-17 DIAGNOSIS — M25511 Pain in right shoulder: Secondary | ICD-10-CM | POA: Diagnosis not present

## 2017-09-22 DIAGNOSIS — M25411 Effusion, right shoulder: Secondary | ICD-10-CM | POA: Diagnosis not present

## 2017-09-22 DIAGNOSIS — M75111 Incomplete rotator cuff tear or rupture of right shoulder, not specified as traumatic: Secondary | ICD-10-CM | POA: Diagnosis not present

## 2017-09-22 DIAGNOSIS — M75101 Unspecified rotator cuff tear or rupture of right shoulder, not specified as traumatic: Secondary | ICD-10-CM | POA: Diagnosis not present

## 2017-09-22 DIAGNOSIS — S46811A Strain of other muscles, fascia and tendons at shoulder and upper arm level, right arm, initial encounter: Secondary | ICD-10-CM | POA: Diagnosis not present

## 2017-09-27 DIAGNOSIS — S42302A Unspecified fracture of shaft of humerus, left arm, initial encounter for closed fracture: Secondary | ICD-10-CM | POA: Diagnosis not present

## 2017-10-01 DIAGNOSIS — H3581 Retinal edema: Secondary | ICD-10-CM | POA: Diagnosis not present

## 2017-10-01 DIAGNOSIS — I712 Thoracic aortic aneurysm, without rupture: Secondary | ICD-10-CM | POA: Diagnosis not present

## 2017-10-01 DIAGNOSIS — I480 Paroxysmal atrial fibrillation: Secondary | ICD-10-CM | POA: Diagnosis not present

## 2017-10-01 DIAGNOSIS — H40041 Steroid responder, right eye: Secondary | ICD-10-CM | POA: Diagnosis not present

## 2017-10-01 DIAGNOSIS — S0591XD Unspecified injury of right eye and orbit, subsequent encounter: Secondary | ICD-10-CM | POA: Diagnosis not present

## 2017-10-01 DIAGNOSIS — H35372 Puckering of macula, left eye: Secondary | ICD-10-CM | POA: Diagnosis not present

## 2017-10-01 DIAGNOSIS — H43812 Vitreous degeneration, left eye: Secondary | ICD-10-CM | POA: Diagnosis not present

## 2017-10-16 DIAGNOSIS — M75111 Incomplete rotator cuff tear or rupture of right shoulder, not specified as traumatic: Secondary | ICD-10-CM | POA: Diagnosis not present

## 2017-10-17 DIAGNOSIS — K641 Second degree hemorrhoids: Secondary | ICD-10-CM | POA: Diagnosis not present

## 2017-10-17 DIAGNOSIS — K645 Perianal venous thrombosis: Secondary | ICD-10-CM | POA: Diagnosis not present

## 2017-10-17 DIAGNOSIS — I4891 Unspecified atrial fibrillation: Secondary | ICD-10-CM | POA: Diagnosis not present

## 2017-10-29 DIAGNOSIS — H43812 Vitreous degeneration, left eye: Secondary | ICD-10-CM | POA: Diagnosis not present

## 2017-10-29 DIAGNOSIS — H02052 Trichiasis without entropian right lower eyelid: Secondary | ICD-10-CM | POA: Diagnosis not present

## 2017-10-29 DIAGNOSIS — S0591XD Unspecified injury of right eye and orbit, subsequent encounter: Secondary | ICD-10-CM | POA: Diagnosis not present

## 2017-11-04 DIAGNOSIS — I4510 Unspecified right bundle-branch block: Secondary | ICD-10-CM | POA: Diagnosis not present

## 2017-11-04 DIAGNOSIS — Z79899 Other long term (current) drug therapy: Secondary | ICD-10-CM | POA: Diagnosis not present

## 2017-11-04 DIAGNOSIS — I482 Chronic atrial fibrillation: Secondary | ICD-10-CM | POA: Diagnosis not present

## 2017-11-04 DIAGNOSIS — Z79891 Long term (current) use of opiate analgesic: Secondary | ICD-10-CM | POA: Diagnosis not present

## 2017-11-04 DIAGNOSIS — Z87891 Personal history of nicotine dependence: Secondary | ICD-10-CM | POA: Diagnosis not present

## 2017-11-04 DIAGNOSIS — R079 Chest pain, unspecified: Secondary | ICD-10-CM | POA: Diagnosis not present

## 2017-11-04 DIAGNOSIS — E785 Hyperlipidemia, unspecified: Secondary | ICD-10-CM | POA: Diagnosis not present

## 2017-11-04 DIAGNOSIS — I4891 Unspecified atrial fibrillation: Secondary | ICD-10-CM | POA: Diagnosis not present

## 2017-11-06 DIAGNOSIS — I480 Paroxysmal atrial fibrillation: Secondary | ICD-10-CM | POA: Diagnosis not present

## 2017-11-06 DIAGNOSIS — I712 Thoracic aortic aneurysm, without rupture, unspecified: Secondary | ICD-10-CM

## 2017-11-06 DIAGNOSIS — R9431 Abnormal electrocardiogram [ECG] [EKG]: Secondary | ICD-10-CM | POA: Diagnosis not present

## 2017-11-06 DIAGNOSIS — M75111 Incomplete rotator cuff tear or rupture of right shoulder, not specified as traumatic: Secondary | ICD-10-CM | POA: Diagnosis not present

## 2017-11-06 HISTORY — DX: Thoracic aortic aneurysm, without rupture, unspecified: I71.20

## 2017-11-27 DIAGNOSIS — R9431 Abnormal electrocardiogram [ECG] [EKG]: Secondary | ICD-10-CM | POA: Diagnosis not present

## 2017-11-27 DIAGNOSIS — I712 Thoracic aortic aneurysm, without rupture: Secondary | ICD-10-CM | POA: Diagnosis not present

## 2017-11-27 DIAGNOSIS — I480 Paroxysmal atrial fibrillation: Secondary | ICD-10-CM | POA: Diagnosis not present

## 2017-12-05 DIAGNOSIS — I712 Thoracic aortic aneurysm, without rupture: Secondary | ICD-10-CM | POA: Diagnosis not present

## 2017-12-05 DIAGNOSIS — I4891 Unspecified atrial fibrillation: Secondary | ICD-10-CM | POA: Diagnosis not present

## 2017-12-05 DIAGNOSIS — I480 Paroxysmal atrial fibrillation: Secondary | ICD-10-CM | POA: Diagnosis not present

## 2017-12-05 DIAGNOSIS — Z87891 Personal history of nicotine dependence: Secondary | ICD-10-CM | POA: Diagnosis not present

## 2017-12-10 DIAGNOSIS — Z9181 History of falling: Secondary | ICD-10-CM | POA: Diagnosis not present

## 2017-12-10 DIAGNOSIS — Z136 Encounter for screening for cardiovascular disorders: Secondary | ICD-10-CM | POA: Diagnosis not present

## 2017-12-10 DIAGNOSIS — Z139 Encounter for screening, unspecified: Secondary | ICD-10-CM | POA: Diagnosis not present

## 2017-12-10 DIAGNOSIS — E785 Hyperlipidemia, unspecified: Secondary | ICD-10-CM | POA: Diagnosis not present

## 2017-12-10 DIAGNOSIS — Z0000 Encounter for general adult medical examination without abnormal findings: Secondary | ICD-10-CM | POA: Diagnosis not present

## 2017-12-31 DIAGNOSIS — Z01818 Encounter for other preprocedural examination: Secondary | ICD-10-CM | POA: Diagnosis not present

## 2017-12-31 DIAGNOSIS — M75111 Incomplete rotator cuff tear or rupture of right shoulder, not specified as traumatic: Secondary | ICD-10-CM | POA: Diagnosis not present

## 2017-12-31 DIAGNOSIS — Z87891 Personal history of nicotine dependence: Secondary | ICD-10-CM | POA: Diagnosis not present

## 2017-12-31 DIAGNOSIS — Z79899 Other long term (current) drug therapy: Secondary | ICD-10-CM | POA: Diagnosis not present

## 2018-01-01 DIAGNOSIS — M75111 Incomplete rotator cuff tear or rupture of right shoulder, not specified as traumatic: Secondary | ICD-10-CM

## 2018-01-01 HISTORY — DX: Incomplete rotator cuff tear or rupture of right shoulder, not specified as traumatic: M75.111

## 2018-01-03 DIAGNOSIS — I712 Thoracic aortic aneurysm, without rupture: Secondary | ICD-10-CM | POA: Diagnosis not present

## 2018-01-03 DIAGNOSIS — I480 Paroxysmal atrial fibrillation: Secondary | ICD-10-CM | POA: Diagnosis not present

## 2018-01-04 DIAGNOSIS — E7800 Pure hypercholesterolemia, unspecified: Secondary | ICD-10-CM | POA: Diagnosis not present

## 2018-01-04 DIAGNOSIS — M75111 Incomplete rotator cuff tear or rupture of right shoulder, not specified as traumatic: Secondary | ICD-10-CM | POA: Diagnosis not present

## 2018-01-04 DIAGNOSIS — G8918 Other acute postprocedural pain: Secondary | ICD-10-CM | POA: Diagnosis not present

## 2018-01-04 DIAGNOSIS — M75101 Unspecified rotator cuff tear or rupture of right shoulder, not specified as traumatic: Secondary | ICD-10-CM | POA: Diagnosis not present

## 2018-01-04 DIAGNOSIS — Z7901 Long term (current) use of anticoagulants: Secondary | ICD-10-CM | POA: Diagnosis not present

## 2018-01-04 DIAGNOSIS — Z79899 Other long term (current) drug therapy: Secondary | ICD-10-CM | POA: Diagnosis not present

## 2018-01-04 DIAGNOSIS — I4891 Unspecified atrial fibrillation: Secondary | ICD-10-CM | POA: Diagnosis not present

## 2018-01-22 DIAGNOSIS — M6281 Muscle weakness (generalized): Secondary | ICD-10-CM | POA: Diagnosis not present

## 2018-01-22 DIAGNOSIS — M25511 Pain in right shoulder: Secondary | ICD-10-CM | POA: Diagnosis not present

## 2018-01-22 DIAGNOSIS — M25611 Stiffness of right shoulder, not elsewhere classified: Secondary | ICD-10-CM | POA: Diagnosis not present

## 2018-01-25 DIAGNOSIS — M25611 Stiffness of right shoulder, not elsewhere classified: Secondary | ICD-10-CM | POA: Diagnosis not present

## 2018-01-25 DIAGNOSIS — M25511 Pain in right shoulder: Secondary | ICD-10-CM | POA: Diagnosis not present

## 2018-01-25 DIAGNOSIS — M6281 Muscle weakness (generalized): Secondary | ICD-10-CM | POA: Diagnosis not present

## 2018-01-28 DIAGNOSIS — M6281 Muscle weakness (generalized): Secondary | ICD-10-CM | POA: Diagnosis not present

## 2018-01-28 DIAGNOSIS — M25511 Pain in right shoulder: Secondary | ICD-10-CM | POA: Diagnosis not present

## 2018-01-28 DIAGNOSIS — M25611 Stiffness of right shoulder, not elsewhere classified: Secondary | ICD-10-CM | POA: Diagnosis not present

## 2018-01-30 DIAGNOSIS — M25511 Pain in right shoulder: Secondary | ICD-10-CM | POA: Diagnosis not present

## 2018-01-30 DIAGNOSIS — M25611 Stiffness of right shoulder, not elsewhere classified: Secondary | ICD-10-CM | POA: Diagnosis not present

## 2018-01-30 DIAGNOSIS — M6281 Muscle weakness (generalized): Secondary | ICD-10-CM | POA: Diagnosis not present

## 2018-02-01 DIAGNOSIS — H43812 Vitreous degeneration, left eye: Secondary | ICD-10-CM | POA: Diagnosis not present

## 2018-02-01 DIAGNOSIS — H35373 Puckering of macula, bilateral: Secondary | ICD-10-CM | POA: Diagnosis not present

## 2018-02-01 DIAGNOSIS — H3581 Retinal edema: Secondary | ICD-10-CM | POA: Diagnosis not present

## 2018-02-01 DIAGNOSIS — S0591XD Unspecified injury of right eye and orbit, subsequent encounter: Secondary | ICD-10-CM | POA: Diagnosis not present

## 2018-02-04 DIAGNOSIS — M6281 Muscle weakness (generalized): Secondary | ICD-10-CM | POA: Diagnosis not present

## 2018-02-04 DIAGNOSIS — M25511 Pain in right shoulder: Secondary | ICD-10-CM | POA: Diagnosis not present

## 2018-02-04 DIAGNOSIS — M25611 Stiffness of right shoulder, not elsewhere classified: Secondary | ICD-10-CM | POA: Diagnosis not present

## 2018-02-06 DIAGNOSIS — M25611 Stiffness of right shoulder, not elsewhere classified: Secondary | ICD-10-CM | POA: Diagnosis not present

## 2018-02-06 DIAGNOSIS — M25511 Pain in right shoulder: Secondary | ICD-10-CM | POA: Diagnosis not present

## 2018-02-06 DIAGNOSIS — M6281 Muscle weakness (generalized): Secondary | ICD-10-CM | POA: Diagnosis not present

## 2018-02-12 DIAGNOSIS — M25511 Pain in right shoulder: Secondary | ICD-10-CM | POA: Diagnosis not present

## 2018-02-12 DIAGNOSIS — M25611 Stiffness of right shoulder, not elsewhere classified: Secondary | ICD-10-CM | POA: Diagnosis not present

## 2018-02-12 DIAGNOSIS — M6281 Muscle weakness (generalized): Secondary | ICD-10-CM | POA: Diagnosis not present

## 2018-02-13 DIAGNOSIS — M25611 Stiffness of right shoulder, not elsewhere classified: Secondary | ICD-10-CM | POA: Diagnosis not present

## 2018-02-13 DIAGNOSIS — M6281 Muscle weakness (generalized): Secondary | ICD-10-CM | POA: Diagnosis not present

## 2018-02-13 DIAGNOSIS — M25511 Pain in right shoulder: Secondary | ICD-10-CM | POA: Diagnosis not present

## 2018-02-18 DIAGNOSIS — M25511 Pain in right shoulder: Secondary | ICD-10-CM | POA: Diagnosis not present

## 2018-02-18 DIAGNOSIS — M6281 Muscle weakness (generalized): Secondary | ICD-10-CM | POA: Diagnosis not present

## 2018-02-18 DIAGNOSIS — M25611 Stiffness of right shoulder, not elsewhere classified: Secondary | ICD-10-CM | POA: Diagnosis not present

## 2018-02-20 DIAGNOSIS — M25511 Pain in right shoulder: Secondary | ICD-10-CM | POA: Diagnosis not present

## 2018-02-20 DIAGNOSIS — M6281 Muscle weakness (generalized): Secondary | ICD-10-CM | POA: Diagnosis not present

## 2018-02-20 DIAGNOSIS — M25611 Stiffness of right shoulder, not elsewhere classified: Secondary | ICD-10-CM | POA: Diagnosis not present

## 2018-02-25 DIAGNOSIS — M25611 Stiffness of right shoulder, not elsewhere classified: Secondary | ICD-10-CM | POA: Diagnosis not present

## 2018-02-25 DIAGNOSIS — M6281 Muscle weakness (generalized): Secondary | ICD-10-CM | POA: Diagnosis not present

## 2018-02-25 DIAGNOSIS — M25511 Pain in right shoulder: Secondary | ICD-10-CM | POA: Diagnosis not present

## 2018-02-27 DIAGNOSIS — M25511 Pain in right shoulder: Secondary | ICD-10-CM | POA: Diagnosis not present

## 2018-02-27 DIAGNOSIS — M6281 Muscle weakness (generalized): Secondary | ICD-10-CM | POA: Diagnosis not present

## 2018-02-27 DIAGNOSIS — M25611 Stiffness of right shoulder, not elsewhere classified: Secondary | ICD-10-CM | POA: Diagnosis not present

## 2018-03-11 DIAGNOSIS — H3581 Retinal edema: Secondary | ICD-10-CM | POA: Diagnosis not present

## 2018-03-11 DIAGNOSIS — S0591XD Unspecified injury of right eye and orbit, subsequent encounter: Secondary | ICD-10-CM | POA: Diagnosis not present

## 2018-03-13 DIAGNOSIS — M6281 Muscle weakness (generalized): Secondary | ICD-10-CM | POA: Diagnosis not present

## 2018-03-13 DIAGNOSIS — M25611 Stiffness of right shoulder, not elsewhere classified: Secondary | ICD-10-CM | POA: Diagnosis not present

## 2018-03-13 DIAGNOSIS — M25511 Pain in right shoulder: Secondary | ICD-10-CM | POA: Diagnosis not present

## 2018-03-14 DIAGNOSIS — M75111 Incomplete rotator cuff tear or rupture of right shoulder, not specified as traumatic: Secondary | ICD-10-CM | POA: Diagnosis not present

## 2018-03-14 DIAGNOSIS — M25512 Pain in left shoulder: Secondary | ICD-10-CM | POA: Diagnosis not present

## 2018-03-14 DIAGNOSIS — S40012A Contusion of left shoulder, initial encounter: Secondary | ICD-10-CM | POA: Diagnosis not present

## 2018-03-14 DIAGNOSIS — M25511 Pain in right shoulder: Secondary | ICD-10-CM | POA: Diagnosis not present

## 2018-03-18 DIAGNOSIS — M25511 Pain in right shoulder: Secondary | ICD-10-CM | POA: Diagnosis not present

## 2018-03-18 DIAGNOSIS — M6281 Muscle weakness (generalized): Secondary | ICD-10-CM | POA: Diagnosis not present

## 2018-03-18 DIAGNOSIS — M25611 Stiffness of right shoulder, not elsewhere classified: Secondary | ICD-10-CM | POA: Diagnosis not present

## 2018-03-20 DIAGNOSIS — M25511 Pain in right shoulder: Secondary | ICD-10-CM | POA: Diagnosis not present

## 2018-03-20 DIAGNOSIS — M25611 Stiffness of right shoulder, not elsewhere classified: Secondary | ICD-10-CM | POA: Diagnosis not present

## 2018-03-20 DIAGNOSIS — M6281 Muscle weakness (generalized): Secondary | ICD-10-CM | POA: Diagnosis not present

## 2018-04-22 DIAGNOSIS — L820 Inflamed seborrheic keratosis: Secondary | ICD-10-CM | POA: Diagnosis not present

## 2018-04-22 DIAGNOSIS — D485 Neoplasm of uncertain behavior of skin: Secondary | ICD-10-CM | POA: Diagnosis not present

## 2018-05-06 DIAGNOSIS — Z23 Encounter for immunization: Secondary | ICD-10-CM | POA: Diagnosis not present

## 2018-06-17 DIAGNOSIS — H3581 Retinal edema: Secondary | ICD-10-CM | POA: Diagnosis not present

## 2018-06-17 DIAGNOSIS — H35373 Puckering of macula, bilateral: Secondary | ICD-10-CM | POA: Diagnosis not present

## 2018-06-17 DIAGNOSIS — H40041 Steroid responder, right eye: Secondary | ICD-10-CM | POA: Diagnosis not present

## 2018-06-17 DIAGNOSIS — H43812 Vitreous degeneration, left eye: Secondary | ICD-10-CM | POA: Diagnosis not present

## 2018-06-17 DIAGNOSIS — S0591XD Unspecified injury of right eye and orbit, subsequent encounter: Secondary | ICD-10-CM | POA: Diagnosis not present

## 2018-07-22 DIAGNOSIS — H16223 Keratoconjunctivitis sicca, not specified as Sjogren's, bilateral: Secondary | ICD-10-CM | POA: Diagnosis not present

## 2018-07-22 DIAGNOSIS — H25012 Cortical age-related cataract, left eye: Secondary | ICD-10-CM | POA: Diagnosis not present

## 2018-07-22 DIAGNOSIS — Z961 Presence of intraocular lens: Secondary | ICD-10-CM | POA: Diagnosis not present

## 2018-07-22 DIAGNOSIS — H25042 Posterior subcapsular polar age-related cataract, left eye: Secondary | ICD-10-CM | POA: Diagnosis not present

## 2018-08-26 DIAGNOSIS — H26491 Other secondary cataract, right eye: Secondary | ICD-10-CM | POA: Diagnosis not present

## 2018-09-03 DIAGNOSIS — Z9841 Cataract extraction status, right eye: Secondary | ICD-10-CM | POA: Diagnosis not present

## 2018-09-14 DIAGNOSIS — J208 Acute bronchitis due to other specified organisms: Secondary | ICD-10-CM | POA: Diagnosis not present

## 2018-09-26 DIAGNOSIS — H8113 Benign paroxysmal vertigo, bilateral: Secondary | ICD-10-CM | POA: Diagnosis not present

## 2018-09-26 DIAGNOSIS — K1120 Sialoadenitis, unspecified: Secondary | ICD-10-CM | POA: Diagnosis not present

## 2018-10-23 DIAGNOSIS — S0591XD Unspecified injury of right eye and orbit, subsequent encounter: Secondary | ICD-10-CM | POA: Diagnosis not present

## 2018-10-23 DIAGNOSIS — H35373 Puckering of macula, bilateral: Secondary | ICD-10-CM | POA: Diagnosis not present

## 2018-10-23 DIAGNOSIS — H3581 Retinal edema: Secondary | ICD-10-CM | POA: Diagnosis not present

## 2018-11-19 DIAGNOSIS — S42302A Unspecified fracture of shaft of humerus, left arm, initial encounter for closed fracture: Secondary | ICD-10-CM | POA: Diagnosis not present

## 2018-11-19 DIAGNOSIS — M25512 Pain in left shoulder: Secondary | ICD-10-CM | POA: Diagnosis not present

## 2019-01-13 DIAGNOSIS — S0990XA Unspecified injury of head, initial encounter: Secondary | ICD-10-CM | POA: Diagnosis not present

## 2019-01-13 DIAGNOSIS — S0003XA Contusion of scalp, initial encounter: Secondary | ICD-10-CM | POA: Diagnosis not present

## 2019-01-13 DIAGNOSIS — S199XXA Unspecified injury of neck, initial encounter: Secondary | ICD-10-CM | POA: Diagnosis not present

## 2019-01-31 DIAGNOSIS — H43812 Vitreous degeneration, left eye: Secondary | ICD-10-CM | POA: Diagnosis not present

## 2019-01-31 DIAGNOSIS — H25812 Combined forms of age-related cataract, left eye: Secondary | ICD-10-CM | POA: Diagnosis not present

## 2019-01-31 DIAGNOSIS — H35373 Puckering of macula, bilateral: Secondary | ICD-10-CM | POA: Diagnosis not present

## 2019-02-04 DIAGNOSIS — Z136 Encounter for screening for cardiovascular disorders: Secondary | ICD-10-CM | POA: Diagnosis not present

## 2019-02-04 DIAGNOSIS — Z139 Encounter for screening, unspecified: Secondary | ICD-10-CM | POA: Diagnosis not present

## 2019-02-04 DIAGNOSIS — Z9181 History of falling: Secondary | ICD-10-CM | POA: Diagnosis not present

## 2019-02-04 DIAGNOSIS — Z0000 Encounter for general adult medical examination without abnormal findings: Secondary | ICD-10-CM | POA: Diagnosis not present

## 2019-02-04 DIAGNOSIS — E785 Hyperlipidemia, unspecified: Secondary | ICD-10-CM | POA: Diagnosis not present

## 2019-02-05 DIAGNOSIS — I2510 Atherosclerotic heart disease of native coronary artery without angina pectoris: Secondary | ICD-10-CM | POA: Insufficient documentation

## 2019-02-05 DIAGNOSIS — I2584 Coronary atherosclerosis due to calcified coronary lesion: Secondary | ICD-10-CM | POA: Diagnosis not present

## 2019-02-05 DIAGNOSIS — E785 Hyperlipidemia, unspecified: Secondary | ICD-10-CM | POA: Diagnosis not present

## 2019-02-05 DIAGNOSIS — I480 Paroxysmal atrial fibrillation: Secondary | ICD-10-CM | POA: Diagnosis not present

## 2019-02-05 DIAGNOSIS — I712 Thoracic aortic aneurysm, without rupture: Secondary | ICD-10-CM | POA: Diagnosis not present

## 2019-02-05 HISTORY — DX: Atherosclerotic heart disease of native coronary artery without angina pectoris: I25.10

## 2019-03-14 DIAGNOSIS — I2584 Coronary atherosclerosis due to calcified coronary lesion: Secondary | ICD-10-CM | POA: Diagnosis not present

## 2019-03-14 DIAGNOSIS — I480 Paroxysmal atrial fibrillation: Secondary | ICD-10-CM | POA: Diagnosis not present

## 2019-03-14 DIAGNOSIS — I712 Thoracic aortic aneurysm, without rupture: Secondary | ICD-10-CM | POA: Diagnosis not present

## 2019-03-14 DIAGNOSIS — I2510 Atherosclerotic heart disease of native coronary artery without angina pectoris: Secondary | ICD-10-CM | POA: Diagnosis not present

## 2019-04-12 DIAGNOSIS — Z23 Encounter for immunization: Secondary | ICD-10-CM | POA: Diagnosis not present

## 2019-05-01 DIAGNOSIS — M25561 Pain in right knee: Secondary | ICD-10-CM | POA: Insufficient documentation

## 2019-05-01 HISTORY — DX: Pain in right knee: M25.561

## 2019-05-06 DIAGNOSIS — M25561 Pain in right knee: Secondary | ICD-10-CM | POA: Diagnosis not present

## 2019-05-15 DIAGNOSIS — H20041 Secondary noninfectious iridocyclitis, right eye: Secondary | ICD-10-CM | POA: Diagnosis not present

## 2019-05-15 DIAGNOSIS — H43812 Vitreous degeneration, left eye: Secondary | ICD-10-CM | POA: Diagnosis not present

## 2019-05-15 DIAGNOSIS — H35373 Puckering of macula, bilateral: Secondary | ICD-10-CM | POA: Diagnosis not present

## 2019-05-22 DIAGNOSIS — M25561 Pain in right knee: Secondary | ICD-10-CM | POA: Diagnosis not present

## 2019-05-28 DIAGNOSIS — M6281 Muscle weakness (generalized): Secondary | ICD-10-CM | POA: Diagnosis not present

## 2019-05-28 DIAGNOSIS — M25562 Pain in left knee: Secondary | ICD-10-CM | POA: Diagnosis not present

## 2019-05-28 DIAGNOSIS — M25662 Stiffness of left knee, not elsewhere classified: Secondary | ICD-10-CM | POA: Diagnosis not present

## 2019-05-30 DIAGNOSIS — M25562 Pain in left knee: Secondary | ICD-10-CM | POA: Diagnosis not present

## 2019-05-30 DIAGNOSIS — M6281 Muscle weakness (generalized): Secondary | ICD-10-CM | POA: Diagnosis not present

## 2019-05-30 DIAGNOSIS — M25662 Stiffness of left knee, not elsewhere classified: Secondary | ICD-10-CM | POA: Diagnosis not present

## 2019-06-02 DIAGNOSIS — M6281 Muscle weakness (generalized): Secondary | ICD-10-CM | POA: Diagnosis not present

## 2019-06-02 DIAGNOSIS — M25662 Stiffness of left knee, not elsewhere classified: Secondary | ICD-10-CM | POA: Diagnosis not present

## 2019-06-02 DIAGNOSIS — M25562 Pain in left knee: Secondary | ICD-10-CM | POA: Diagnosis not present

## 2019-06-04 DIAGNOSIS — M6281 Muscle weakness (generalized): Secondary | ICD-10-CM | POA: Diagnosis not present

## 2019-06-04 DIAGNOSIS — M25562 Pain in left knee: Secondary | ICD-10-CM | POA: Diagnosis not present

## 2019-06-04 DIAGNOSIS — M25662 Stiffness of left knee, not elsewhere classified: Secondary | ICD-10-CM | POA: Diagnosis not present

## 2019-06-10 DIAGNOSIS — M25561 Pain in right knee: Secondary | ICD-10-CM | POA: Diagnosis not present

## 2019-06-10 DIAGNOSIS — G8929 Other chronic pain: Secondary | ICD-10-CM | POA: Diagnosis not present

## 2019-06-11 DIAGNOSIS — M25562 Pain in left knee: Secondary | ICD-10-CM | POA: Diagnosis not present

## 2019-06-11 DIAGNOSIS — M6281 Muscle weakness (generalized): Secondary | ICD-10-CM | POA: Diagnosis not present

## 2019-06-11 DIAGNOSIS — M25662 Stiffness of left knee, not elsewhere classified: Secondary | ICD-10-CM | POA: Diagnosis not present

## 2019-06-13 DIAGNOSIS — M25562 Pain in left knee: Secondary | ICD-10-CM | POA: Diagnosis not present

## 2019-06-13 DIAGNOSIS — M25662 Stiffness of left knee, not elsewhere classified: Secondary | ICD-10-CM | POA: Diagnosis not present

## 2019-06-13 DIAGNOSIS — M6281 Muscle weakness (generalized): Secondary | ICD-10-CM | POA: Diagnosis not present

## 2019-06-17 DIAGNOSIS — M6281 Muscle weakness (generalized): Secondary | ICD-10-CM | POA: Diagnosis not present

## 2019-06-17 DIAGNOSIS — M25662 Stiffness of left knee, not elsewhere classified: Secondary | ICD-10-CM | POA: Diagnosis not present

## 2019-06-17 DIAGNOSIS — M25562 Pain in left knee: Secondary | ICD-10-CM | POA: Diagnosis not present

## 2019-06-25 DIAGNOSIS — S83271A Complex tear of lateral meniscus, current injury, right knee, initial encounter: Secondary | ICD-10-CM | POA: Diagnosis not present

## 2019-06-25 DIAGNOSIS — M6751 Plica syndrome, right knee: Secondary | ICD-10-CM | POA: Diagnosis not present

## 2019-06-25 DIAGNOSIS — M659 Synovitis and tenosynovitis, unspecified: Secondary | ICD-10-CM | POA: Diagnosis not present

## 2019-06-25 DIAGNOSIS — S83231A Complex tear of medial meniscus, current injury, right knee, initial encounter: Secondary | ICD-10-CM | POA: Diagnosis not present

## 2019-06-25 DIAGNOSIS — M94261 Chondromalacia, right knee: Secondary | ICD-10-CM | POA: Diagnosis not present

## 2019-06-30 DIAGNOSIS — R2689 Other abnormalities of gait and mobility: Secondary | ICD-10-CM | POA: Diagnosis not present

## 2019-06-30 DIAGNOSIS — M25661 Stiffness of right knee, not elsewhere classified: Secondary | ICD-10-CM | POA: Diagnosis not present

## 2019-06-30 DIAGNOSIS — M25561 Pain in right knee: Secondary | ICD-10-CM | POA: Diagnosis not present

## 2019-06-30 DIAGNOSIS — M25461 Effusion, right knee: Secondary | ICD-10-CM | POA: Diagnosis not present

## 2019-07-23 DIAGNOSIS — D1801 Hemangioma of skin and subcutaneous tissue: Secondary | ICD-10-CM | POA: Diagnosis not present

## 2019-07-23 DIAGNOSIS — R2689 Other abnormalities of gait and mobility: Secondary | ICD-10-CM | POA: Diagnosis not present

## 2019-07-23 DIAGNOSIS — M25461 Effusion, right knee: Secondary | ICD-10-CM | POA: Diagnosis not present

## 2019-07-23 DIAGNOSIS — M25561 Pain in right knee: Secondary | ICD-10-CM | POA: Diagnosis not present

## 2019-07-23 DIAGNOSIS — M25661 Stiffness of right knee, not elsewhere classified: Secondary | ICD-10-CM | POA: Diagnosis not present

## 2019-07-23 DIAGNOSIS — L821 Other seborrheic keratosis: Secondary | ICD-10-CM | POA: Diagnosis not present

## 2019-07-28 DIAGNOSIS — M25461 Effusion, right knee: Secondary | ICD-10-CM | POA: Diagnosis not present

## 2019-07-28 DIAGNOSIS — M25661 Stiffness of right knee, not elsewhere classified: Secondary | ICD-10-CM | POA: Diagnosis not present

## 2019-07-28 DIAGNOSIS — M25561 Pain in right knee: Secondary | ICD-10-CM | POA: Diagnosis not present

## 2019-07-28 DIAGNOSIS — R2689 Other abnormalities of gait and mobility: Secondary | ICD-10-CM | POA: Diagnosis not present

## 2019-07-31 DIAGNOSIS — M25561 Pain in right knee: Secondary | ICD-10-CM | POA: Diagnosis not present

## 2019-07-31 DIAGNOSIS — M25461 Effusion, right knee: Secondary | ICD-10-CM | POA: Diagnosis not present

## 2019-07-31 DIAGNOSIS — M25661 Stiffness of right knee, not elsewhere classified: Secondary | ICD-10-CM | POA: Diagnosis not present

## 2019-07-31 DIAGNOSIS — R2689 Other abnormalities of gait and mobility: Secondary | ICD-10-CM | POA: Diagnosis not present

## 2019-08-26 DIAGNOSIS — L820 Inflamed seborrheic keratosis: Secondary | ICD-10-CM | POA: Diagnosis not present

## 2019-08-26 DIAGNOSIS — L728 Other follicular cysts of the skin and subcutaneous tissue: Secondary | ICD-10-CM | POA: Diagnosis not present

## 2019-08-26 DIAGNOSIS — D1721 Benign lipomatous neoplasm of skin and subcutaneous tissue of right arm: Secondary | ICD-10-CM | POA: Diagnosis not present

## 2020-01-08 DIAGNOSIS — M25561 Pain in right knee: Secondary | ICD-10-CM | POA: Diagnosis not present

## 2020-01-08 DIAGNOSIS — Z789 Other specified health status: Secondary | ICD-10-CM

## 2020-01-08 HISTORY — DX: Other specified health status: Z78.9

## 2020-01-13 DIAGNOSIS — L820 Inflamed seborrheic keratosis: Secondary | ICD-10-CM | POA: Diagnosis not present

## 2020-01-16 DIAGNOSIS — M94261 Chondromalacia, right knee: Secondary | ICD-10-CM | POA: Diagnosis not present

## 2020-01-16 DIAGNOSIS — M25461 Effusion, right knee: Secondary | ICD-10-CM | POA: Diagnosis not present

## 2020-01-16 DIAGNOSIS — M25561 Pain in right knee: Secondary | ICD-10-CM | POA: Diagnosis not present

## 2020-01-16 DIAGNOSIS — M67863 Other specified disorders of tendon, right knee: Secondary | ICD-10-CM | POA: Diagnosis not present

## 2020-02-09 DIAGNOSIS — E785 Hyperlipidemia, unspecified: Secondary | ICD-10-CM | POA: Diagnosis not present

## 2020-02-09 DIAGNOSIS — I2510 Atherosclerotic heart disease of native coronary artery without angina pectoris: Secondary | ICD-10-CM | POA: Diagnosis not present

## 2020-02-09 DIAGNOSIS — Z9181 History of falling: Secondary | ICD-10-CM | POA: Diagnosis not present

## 2020-02-09 DIAGNOSIS — Z139 Encounter for screening, unspecified: Secondary | ICD-10-CM | POA: Diagnosis not present

## 2020-02-09 DIAGNOSIS — Z0000 Encounter for general adult medical examination without abnormal findings: Secondary | ICD-10-CM | POA: Diagnosis not present

## 2020-02-09 DIAGNOSIS — I480 Paroxysmal atrial fibrillation: Secondary | ICD-10-CM | POA: Diagnosis not present

## 2020-02-09 DIAGNOSIS — I2584 Coronary atherosclerosis due to calcified coronary lesion: Secondary | ICD-10-CM | POA: Diagnosis not present

## 2020-02-09 DIAGNOSIS — I712 Thoracic aortic aneurysm, without rupture: Secondary | ICD-10-CM | POA: Diagnosis not present

## 2020-02-12 DIAGNOSIS — M25561 Pain in right knee: Secondary | ICD-10-CM | POA: Diagnosis not present

## 2020-02-18 DIAGNOSIS — H20041 Secondary noninfectious iridocyclitis, right eye: Secondary | ICD-10-CM | POA: Diagnosis not present

## 2020-02-18 DIAGNOSIS — H35373 Puckering of macula, bilateral: Secondary | ICD-10-CM | POA: Diagnosis not present

## 2020-02-18 DIAGNOSIS — H43812 Vitreous degeneration, left eye: Secondary | ICD-10-CM | POA: Diagnosis not present

## 2020-02-19 DIAGNOSIS — M25561 Pain in right knee: Secondary | ICD-10-CM | POA: Diagnosis not present

## 2020-02-19 DIAGNOSIS — M6281 Muscle weakness (generalized): Secondary | ICD-10-CM | POA: Diagnosis not present

## 2020-03-03 DIAGNOSIS — M6281 Muscle weakness (generalized): Secondary | ICD-10-CM | POA: Diagnosis not present

## 2020-03-03 DIAGNOSIS — M25561 Pain in right knee: Secondary | ICD-10-CM | POA: Diagnosis not present

## 2020-03-10 DIAGNOSIS — M25561 Pain in right knee: Secondary | ICD-10-CM | POA: Diagnosis not present

## 2020-03-10 DIAGNOSIS — M6281 Muscle weakness (generalized): Secondary | ICD-10-CM | POA: Diagnosis not present

## 2020-03-16 DIAGNOSIS — I712 Thoracic aortic aneurysm, without rupture: Secondary | ICD-10-CM | POA: Diagnosis not present

## 2020-03-16 DIAGNOSIS — I2584 Coronary atherosclerosis due to calcified coronary lesion: Secondary | ICD-10-CM | POA: Diagnosis not present

## 2020-03-16 DIAGNOSIS — E785 Hyperlipidemia, unspecified: Secondary | ICD-10-CM | POA: Diagnosis not present

## 2020-03-16 DIAGNOSIS — I480 Paroxysmal atrial fibrillation: Secondary | ICD-10-CM | POA: Diagnosis not present

## 2020-03-16 DIAGNOSIS — I2510 Atherosclerotic heart disease of native coronary artery without angina pectoris: Secondary | ICD-10-CM | POA: Diagnosis not present

## 2020-03-17 DIAGNOSIS — I77810 Thoracic aortic ectasia: Secondary | ICD-10-CM | POA: Diagnosis not present

## 2020-03-17 DIAGNOSIS — I517 Cardiomegaly: Secondary | ICD-10-CM | POA: Diagnosis not present

## 2020-03-17 DIAGNOSIS — I480 Paroxysmal atrial fibrillation: Secondary | ICD-10-CM | POA: Diagnosis not present

## 2020-03-17 DIAGNOSIS — I4891 Unspecified atrial fibrillation: Secondary | ICD-10-CM | POA: Diagnosis not present

## 2020-03-17 DIAGNOSIS — Z79899 Other long term (current) drug therapy: Secondary | ICD-10-CM | POA: Diagnosis not present

## 2020-03-18 DIAGNOSIS — I480 Paroxysmal atrial fibrillation: Secondary | ICD-10-CM | POA: Diagnosis not present

## 2020-03-23 DIAGNOSIS — D485 Neoplasm of uncertain behavior of skin: Secondary | ICD-10-CM | POA: Diagnosis not present

## 2020-03-23 DIAGNOSIS — I781 Nevus, non-neoplastic: Secondary | ICD-10-CM | POA: Diagnosis not present

## 2020-04-05 DIAGNOSIS — I480 Paroxysmal atrial fibrillation: Secondary | ICD-10-CM | POA: Diagnosis not present

## 2020-04-05 DIAGNOSIS — I712 Thoracic aortic aneurysm, without rupture: Secondary | ICD-10-CM | POA: Diagnosis not present

## 2020-04-05 DIAGNOSIS — I2510 Atherosclerotic heart disease of native coronary artery without angina pectoris: Secondary | ICD-10-CM | POA: Diagnosis not present

## 2020-04-05 DIAGNOSIS — I2584 Coronary atherosclerosis due to calcified coronary lesion: Secondary | ICD-10-CM | POA: Diagnosis not present

## 2020-04-07 DIAGNOSIS — M25561 Pain in right knee: Secondary | ICD-10-CM | POA: Diagnosis not present

## 2020-04-07 DIAGNOSIS — M25562 Pain in left knee: Secondary | ICD-10-CM | POA: Diagnosis not present

## 2020-04-07 DIAGNOSIS — M6281 Muscle weakness (generalized): Secondary | ICD-10-CM | POA: Diagnosis not present

## 2020-04-09 DIAGNOSIS — M6281 Muscle weakness (generalized): Secondary | ICD-10-CM | POA: Diagnosis not present

## 2020-04-09 DIAGNOSIS — M25562 Pain in left knee: Secondary | ICD-10-CM | POA: Diagnosis not present

## 2020-04-09 DIAGNOSIS — M25561 Pain in right knee: Secondary | ICD-10-CM | POA: Diagnosis not present

## 2020-04-20 DIAGNOSIS — M25561 Pain in right knee: Secondary | ICD-10-CM | POA: Diagnosis not present

## 2020-04-20 DIAGNOSIS — M25562 Pain in left knee: Secondary | ICD-10-CM | POA: Diagnosis not present

## 2020-04-20 DIAGNOSIS — M6281 Muscle weakness (generalized): Secondary | ICD-10-CM | POA: Diagnosis not present

## 2020-04-23 DIAGNOSIS — M6281 Muscle weakness (generalized): Secondary | ICD-10-CM | POA: Diagnosis not present

## 2020-04-23 DIAGNOSIS — M25562 Pain in left knee: Secondary | ICD-10-CM | POA: Diagnosis not present

## 2020-04-23 DIAGNOSIS — M25561 Pain in right knee: Secondary | ICD-10-CM | POA: Diagnosis not present

## 2020-04-27 DIAGNOSIS — M25562 Pain in left knee: Secondary | ICD-10-CM | POA: Diagnosis not present

## 2020-04-27 DIAGNOSIS — M25561 Pain in right knee: Secondary | ICD-10-CM | POA: Diagnosis not present

## 2020-04-27 DIAGNOSIS — M6281 Muscle weakness (generalized): Secondary | ICD-10-CM | POA: Diagnosis not present

## 2020-04-29 DIAGNOSIS — M6281 Muscle weakness (generalized): Secondary | ICD-10-CM | POA: Diagnosis not present

## 2020-04-29 DIAGNOSIS — M25561 Pain in right knee: Secondary | ICD-10-CM | POA: Diagnosis not present

## 2020-04-29 DIAGNOSIS — M25562 Pain in left knee: Secondary | ICD-10-CM | POA: Diagnosis not present

## 2020-05-03 DIAGNOSIS — M25561 Pain in right knee: Secondary | ICD-10-CM | POA: Diagnosis not present

## 2020-05-03 DIAGNOSIS — M25562 Pain in left knee: Secondary | ICD-10-CM | POA: Diagnosis not present

## 2020-05-03 DIAGNOSIS — M6281 Muscle weakness (generalized): Secondary | ICD-10-CM | POA: Diagnosis not present

## 2020-05-14 DIAGNOSIS — Z23 Encounter for immunization: Secondary | ICD-10-CM | POA: Diagnosis not present

## 2020-05-19 DIAGNOSIS — M25562 Pain in left knee: Secondary | ICD-10-CM | POA: Diagnosis not present

## 2020-05-19 DIAGNOSIS — M6281 Muscle weakness (generalized): Secondary | ICD-10-CM | POA: Diagnosis not present

## 2020-05-19 DIAGNOSIS — M25561 Pain in right knee: Secondary | ICD-10-CM | POA: Diagnosis not present

## 2020-05-27 DIAGNOSIS — M25562 Pain in left knee: Secondary | ICD-10-CM | POA: Diagnosis not present

## 2020-05-27 DIAGNOSIS — M25561 Pain in right knee: Secondary | ICD-10-CM | POA: Diagnosis not present

## 2020-05-27 DIAGNOSIS — M6281 Muscle weakness (generalized): Secondary | ICD-10-CM | POA: Diagnosis not present

## 2020-06-11 ENCOUNTER — Ambulatory Visit (INDEPENDENT_AMBULATORY_CARE_PROVIDER_SITE_OTHER): Payer: Medicare Other

## 2020-06-11 ENCOUNTER — Ambulatory Visit: Payer: Medicare Other | Admitting: Podiatry

## 2020-06-11 ENCOUNTER — Other Ambulatory Visit: Payer: Self-pay

## 2020-06-11 VITALS — BP 134/84 | HR 76 | Temp 98.5°F

## 2020-06-11 DIAGNOSIS — M79674 Pain in right toe(s): Secondary | ICD-10-CM

## 2020-06-11 DIAGNOSIS — M2041 Other hammer toe(s) (acquired), right foot: Secondary | ICD-10-CM | POA: Diagnosis not present

## 2020-06-22 NOTE — Progress Notes (Signed)
  Subjective:  Patient ID: Douglas Dudley, male    DOB: 1945/11/12,  MRN: [id number]  Chief Complaint  Patient presents with  . Hammer Toe    toes 2-4. crushed them years ago, painful only can wear sandals now     74 y.o. male presents with the above complaint. History confirmed with patient.  States that the second third and fourth toes are painful is unable to wear sandals states that the toes are misshapened impression years ago he is on Xarelto.  Objective:  Physical Exam: warm, good capillary refill, no trophic changes or ulcerative lesions, normal DP and PT pulses and normal sensory exam. Right Foot: digital contractures noted with POP   No images are attached to the encounter.  Radiographs: X-ray of the right foot: no fracture, dislocation, swelling or degenerative changes noted and digital contractures Assessment:   1. Hammertoe of right foot   2. Pain in toe of right foot    Plan:  Patient was evaluated and treated and all questions answered.  Hammertoe -XR reviewed with patient -Educated on etiology of deformity -Discussed padding and shoe gear changes -Dispensed toe spacers  No follow-ups on file.

## 2020-06-23 DIAGNOSIS — Z0000 Encounter for general adult medical examination without abnormal findings: Secondary | ICD-10-CM | POA: Diagnosis not present

## 2020-06-30 DIAGNOSIS — I872 Venous insufficiency (chronic) (peripheral): Secondary | ICD-10-CM

## 2020-06-30 DIAGNOSIS — H35349 Macular cyst, hole, or pseudohole, unspecified eye: Secondary | ICD-10-CM | POA: Insufficient documentation

## 2020-06-30 DIAGNOSIS — Z860100 Personal history of colon polyps, unspecified: Secondary | ICD-10-CM

## 2020-06-30 DIAGNOSIS — I8390 Asymptomatic varicose veins of unspecified lower extremity: Secondary | ICD-10-CM | POA: Insufficient documentation

## 2020-06-30 DIAGNOSIS — H43819 Vitreous degeneration, unspecified eye: Secondary | ICD-10-CM | POA: Insufficient documentation

## 2020-06-30 DIAGNOSIS — R42 Dizziness and giddiness: Secondary | ICD-10-CM

## 2020-06-30 DIAGNOSIS — M545 Low back pain, unspecified: Secondary | ICD-10-CM | POA: Insufficient documentation

## 2020-06-30 DIAGNOSIS — H905 Unspecified sensorineural hearing loss: Secondary | ICD-10-CM

## 2020-06-30 DIAGNOSIS — D179 Benign lipomatous neoplasm, unspecified: Secondary | ICD-10-CM

## 2020-06-30 DIAGNOSIS — J329 Chronic sinusitis, unspecified: Secondary | ICD-10-CM | POA: Insufficient documentation

## 2020-06-30 DIAGNOSIS — L909 Atrophic disorder of skin, unspecified: Secondary | ICD-10-CM

## 2020-06-30 DIAGNOSIS — I119 Hypertensive heart disease without heart failure: Secondary | ICD-10-CM

## 2020-06-30 DIAGNOSIS — R933 Abnormal findings on diagnostic imaging of other parts of digestive tract: Secondary | ICD-10-CM | POA: Insufficient documentation

## 2020-06-30 DIAGNOSIS — I10 Essential (primary) hypertension: Secondary | ICD-10-CM

## 2020-06-30 DIAGNOSIS — S058X9A Other injuries of unspecified eye and orbit, initial encounter: Secondary | ICD-10-CM | POA: Insufficient documentation

## 2020-06-30 DIAGNOSIS — R141 Gas pain: Secondary | ICD-10-CM

## 2020-06-30 DIAGNOSIS — K219 Gastro-esophageal reflux disease without esophagitis: Secondary | ICD-10-CM

## 2020-06-30 DIAGNOSIS — F32A Depression, unspecified: Secondary | ICD-10-CM | POA: Insufficient documentation

## 2020-06-30 DIAGNOSIS — K589 Irritable bowel syndrome without diarrhea: Secondary | ICD-10-CM | POA: Insufficient documentation

## 2020-06-30 DIAGNOSIS — H526 Other disorders of refraction: Secondary | ICD-10-CM | POA: Insufficient documentation

## 2020-06-30 DIAGNOSIS — M542 Cervicalgia: Secondary | ICD-10-CM

## 2020-06-30 HISTORY — DX: Other disorders of refraction: H52.6

## 2020-06-30 HISTORY — DX: Depression, unspecified: F32.A

## 2020-06-30 HISTORY — DX: Atrophic disorder of skin, unspecified: L90.9

## 2020-06-30 HISTORY — DX: Hypertensive heart disease without heart failure: I11.9

## 2020-06-30 HISTORY — DX: Asymptomatic varicose veins of unspecified lower extremity: I83.90

## 2020-06-30 HISTORY — DX: Other injuries of unspecified eye and orbit, initial encounter: S05.8X9A

## 2020-06-30 HISTORY — DX: Venous insufficiency (chronic) (peripheral): I87.2

## 2020-06-30 HISTORY — DX: Vitreous degeneration, unspecified eye: H43.819

## 2020-06-30 HISTORY — DX: Gastro-esophageal reflux disease without esophagitis: K21.9

## 2020-06-30 HISTORY — DX: Macular cyst, hole, or pseudohole, unspecified eye: H35.349

## 2020-06-30 HISTORY — DX: Essential (primary) hypertension: I10

## 2020-06-30 HISTORY — DX: Gas pain: R14.1

## 2020-06-30 HISTORY — DX: Personal history of colonic polyps: Z86.010

## 2020-06-30 HISTORY — DX: Personal history of colon polyps, unspecified: Z86.0100

## 2020-06-30 HISTORY — DX: Unspecified sensorineural hearing loss: H90.5

## 2020-06-30 HISTORY — DX: Dizziness and giddiness: R42

## 2020-06-30 HISTORY — DX: Chronic sinusitis, unspecified: J32.9

## 2020-06-30 HISTORY — DX: Abnormal findings on diagnostic imaging of other parts of digestive tract: R93.3

## 2020-06-30 HISTORY — DX: Low back pain, unspecified: M54.50

## 2020-06-30 HISTORY — DX: Benign lipomatous neoplasm, unspecified: D17.9

## 2020-06-30 HISTORY — DX: Irritable bowel syndrome, unspecified: K58.9

## 2020-06-30 HISTORY — DX: Cervicalgia: M54.2

## 2020-07-21 DIAGNOSIS — M1711 Unilateral primary osteoarthritis, right knee: Secondary | ICD-10-CM | POA: Diagnosis not present

## 2020-07-24 HISTORY — PX: CATARACT EXTRACTION: SUR2

## 2020-07-26 DIAGNOSIS — R1013 Epigastric pain: Secondary | ICD-10-CM | POA: Diagnosis not present

## 2020-07-26 DIAGNOSIS — B9689 Other specified bacterial agents as the cause of diseases classified elsewhere: Secondary | ICD-10-CM | POA: Diagnosis not present

## 2020-07-26 DIAGNOSIS — J019 Acute sinusitis, unspecified: Secondary | ICD-10-CM | POA: Diagnosis not present

## 2020-07-30 DIAGNOSIS — Z1159 Encounter for screening for other viral diseases: Secondary | ICD-10-CM | POA: Diagnosis not present

## 2020-08-04 DIAGNOSIS — D122 Benign neoplasm of ascending colon: Secondary | ICD-10-CM | POA: Diagnosis not present

## 2020-08-04 DIAGNOSIS — D120 Benign neoplasm of cecum: Secondary | ICD-10-CM | POA: Diagnosis not present

## 2020-08-04 DIAGNOSIS — K635 Polyp of colon: Secondary | ICD-10-CM | POA: Diagnosis not present

## 2020-08-04 DIAGNOSIS — Z86010 Personal history of colonic polyps: Secondary | ICD-10-CM | POA: Diagnosis not present

## 2020-08-04 DIAGNOSIS — D124 Benign neoplasm of descending colon: Secondary | ICD-10-CM | POA: Diagnosis not present

## 2020-08-06 DIAGNOSIS — D122 Benign neoplasm of ascending colon: Secondary | ICD-10-CM | POA: Diagnosis not present

## 2020-08-06 DIAGNOSIS — D124 Benign neoplasm of descending colon: Secondary | ICD-10-CM | POA: Diagnosis not present

## 2020-08-06 DIAGNOSIS — K635 Polyp of colon: Secondary | ICD-10-CM | POA: Diagnosis not present

## 2020-08-06 DIAGNOSIS — D120 Benign neoplasm of cecum: Secondary | ICD-10-CM | POA: Diagnosis not present

## 2020-08-10 DIAGNOSIS — J019 Acute sinusitis, unspecified: Secondary | ICD-10-CM | POA: Diagnosis not present

## 2020-08-10 DIAGNOSIS — B9689 Other specified bacterial agents as the cause of diseases classified elsewhere: Secondary | ICD-10-CM | POA: Diagnosis not present

## 2020-08-26 DIAGNOSIS — M1711 Unilateral primary osteoarthritis, right knee: Secondary | ICD-10-CM | POA: Diagnosis not present

## 2020-08-26 DIAGNOSIS — S8001XA Contusion of right knee, initial encounter: Secondary | ICD-10-CM | POA: Diagnosis not present

## 2020-09-23 DIAGNOSIS — S43202S Unspecified subluxation of left sternoclavicular joint, sequela: Secondary | ICD-10-CM | POA: Diagnosis not present

## 2020-10-27 DIAGNOSIS — M1711 Unilateral primary osteoarthritis, right knee: Secondary | ICD-10-CM | POA: Diagnosis not present

## 2020-11-08 DIAGNOSIS — H2512 Age-related nuclear cataract, left eye: Secondary | ICD-10-CM | POA: Diagnosis not present

## 2020-11-17 DIAGNOSIS — H2512 Age-related nuclear cataract, left eye: Secondary | ICD-10-CM | POA: Diagnosis not present

## 2020-11-17 DIAGNOSIS — Z01818 Encounter for other preprocedural examination: Secondary | ICD-10-CM | POA: Diagnosis not present

## 2020-12-04 DIAGNOSIS — R0902 Hypoxemia: Secondary | ICD-10-CM | POA: Diagnosis not present

## 2020-12-04 DIAGNOSIS — Z743 Need for continuous supervision: Secondary | ICD-10-CM | POA: Diagnosis not present

## 2020-12-04 DIAGNOSIS — N200 Calculus of kidney: Secondary | ICD-10-CM | POA: Diagnosis not present

## 2020-12-04 DIAGNOSIS — R112 Nausea with vomiting, unspecified: Secondary | ICD-10-CM | POA: Diagnosis not present

## 2020-12-04 DIAGNOSIS — R109 Unspecified abdominal pain: Secondary | ICD-10-CM | POA: Diagnosis not present

## 2020-12-04 DIAGNOSIS — R6889 Other general symptoms and signs: Secondary | ICD-10-CM | POA: Diagnosis not present

## 2020-12-06 DIAGNOSIS — N200 Calculus of kidney: Secondary | ICD-10-CM | POA: Diagnosis not present

## 2020-12-06 DIAGNOSIS — Z79899 Other long term (current) drug therapy: Secondary | ICD-10-CM | POA: Diagnosis not present

## 2020-12-06 DIAGNOSIS — Z7901 Long term (current) use of anticoagulants: Secondary | ICD-10-CM | POA: Diagnosis not present

## 2021-01-03 DIAGNOSIS — N200 Calculus of kidney: Secondary | ICD-10-CM | POA: Diagnosis not present

## 2021-01-04 DIAGNOSIS — H35352 Cystoid macular degeneration, left eye: Secondary | ICD-10-CM | POA: Diagnosis not present

## 2021-03-16 DIAGNOSIS — Z0000 Encounter for general adult medical examination without abnormal findings: Secondary | ICD-10-CM | POA: Diagnosis not present

## 2021-03-16 DIAGNOSIS — E785 Hyperlipidemia, unspecified: Secondary | ICD-10-CM | POA: Diagnosis not present

## 2021-03-16 DIAGNOSIS — Z9181 History of falling: Secondary | ICD-10-CM | POA: Diagnosis not present

## 2021-03-16 DIAGNOSIS — Z139 Encounter for screening, unspecified: Secondary | ICD-10-CM | POA: Diagnosis not present

## 2021-04-06 DIAGNOSIS — H35373 Puckering of macula, bilateral: Secondary | ICD-10-CM | POA: Diagnosis not present

## 2021-04-06 DIAGNOSIS — H43812 Vitreous degeneration, left eye: Secondary | ICD-10-CM | POA: Diagnosis not present

## 2021-04-12 DIAGNOSIS — I10 Essential (primary) hypertension: Secondary | ICD-10-CM | POA: Diagnosis not present

## 2021-04-12 DIAGNOSIS — E785 Hyperlipidemia, unspecified: Secondary | ICD-10-CM | POA: Diagnosis not present

## 2021-04-29 DIAGNOSIS — Z23 Encounter for immunization: Secondary | ICD-10-CM | POA: Diagnosis not present

## 2021-06-25 DIAGNOSIS — Z0000 Encounter for general adult medical examination without abnormal findings: Secondary | ICD-10-CM | POA: Diagnosis not present

## 2021-07-22 DIAGNOSIS — I480 Paroxysmal atrial fibrillation: Secondary | ICD-10-CM | POA: Diagnosis not present

## 2021-07-22 DIAGNOSIS — I10 Essential (primary) hypertension: Secondary | ICD-10-CM | POA: Diagnosis not present

## 2021-10-06 DIAGNOSIS — R195 Other fecal abnormalities: Secondary | ICD-10-CM | POA: Diagnosis not present

## 2022-01-07 DIAGNOSIS — E785 Hyperlipidemia, unspecified: Secondary | ICD-10-CM | POA: Diagnosis not present

## 2022-01-07 DIAGNOSIS — Z79899 Other long term (current) drug therapy: Secondary | ICD-10-CM | POA: Diagnosis not present

## 2022-01-07 DIAGNOSIS — E559 Vitamin D deficiency, unspecified: Secondary | ICD-10-CM | POA: Diagnosis not present

## 2022-01-07 DIAGNOSIS — I480 Paroxysmal atrial fibrillation: Secondary | ICD-10-CM | POA: Diagnosis not present

## 2022-01-07 DIAGNOSIS — I7121 Aneurysm of the ascending aorta, without rupture: Secondary | ICD-10-CM | POA: Diagnosis not present

## 2022-01-07 DIAGNOSIS — E538 Deficiency of other specified B group vitamins: Secondary | ICD-10-CM | POA: Diagnosis not present

## 2022-04-17 DIAGNOSIS — R233 Spontaneous ecchymoses: Secondary | ICD-10-CM | POA: Diagnosis not present

## 2022-04-17 DIAGNOSIS — L820 Inflamed seborrheic keratosis: Secondary | ICD-10-CM | POA: Diagnosis not present

## 2022-04-17 DIAGNOSIS — L578 Other skin changes due to chronic exposure to nonionizing radiation: Secondary | ICD-10-CM | POA: Diagnosis not present

## 2022-06-08 DIAGNOSIS — J019 Acute sinusitis, unspecified: Secondary | ICD-10-CM | POA: Diagnosis not present

## 2022-06-08 DIAGNOSIS — J208 Acute bronchitis due to other specified organisms: Secondary | ICD-10-CM | POA: Diagnosis not present

## 2022-07-03 DIAGNOSIS — M791 Myalgia, unspecified site: Secondary | ICD-10-CM | POA: Diagnosis not present

## 2022-07-03 DIAGNOSIS — M9902 Segmental and somatic dysfunction of thoracic region: Secondary | ICD-10-CM | POA: Diagnosis not present

## 2022-07-03 DIAGNOSIS — M9903 Segmental and somatic dysfunction of lumbar region: Secondary | ICD-10-CM | POA: Diagnosis not present

## 2022-07-03 DIAGNOSIS — M5384 Other specified dorsopathies, thoracic region: Secondary | ICD-10-CM | POA: Diagnosis not present

## 2022-07-03 DIAGNOSIS — M9901 Segmental and somatic dysfunction of cervical region: Secondary | ICD-10-CM | POA: Diagnosis not present

## 2022-07-08 DIAGNOSIS — E559 Vitamin D deficiency, unspecified: Secondary | ICD-10-CM | POA: Diagnosis not present

## 2022-07-08 DIAGNOSIS — I7121 Aneurysm of the ascending aorta, without rupture: Secondary | ICD-10-CM | POA: Diagnosis not present

## 2022-07-08 DIAGNOSIS — I480 Paroxysmal atrial fibrillation: Secondary | ICD-10-CM | POA: Diagnosis not present

## 2022-07-08 DIAGNOSIS — Z79899 Other long term (current) drug therapy: Secondary | ICD-10-CM | POA: Diagnosis not present

## 2022-07-08 DIAGNOSIS — E785 Hyperlipidemia, unspecified: Secondary | ICD-10-CM | POA: Diagnosis not present

## 2022-08-16 DIAGNOSIS — H35373 Puckering of macula, bilateral: Secondary | ICD-10-CM | POA: Diagnosis not present

## 2022-08-16 DIAGNOSIS — H3581 Retinal edema: Secondary | ICD-10-CM | POA: Diagnosis not present

## 2022-08-16 DIAGNOSIS — H40041 Steroid responder, right eye: Secondary | ICD-10-CM | POA: Diagnosis not present

## 2022-08-16 DIAGNOSIS — H43812 Vitreous degeneration, left eye: Secondary | ICD-10-CM | POA: Diagnosis not present

## 2022-08-24 DIAGNOSIS — M5412 Radiculopathy, cervical region: Secondary | ICD-10-CM | POA: Diagnosis not present

## 2022-08-24 DIAGNOSIS — M47812 Spondylosis without myelopathy or radiculopathy, cervical region: Secondary | ICD-10-CM | POA: Diagnosis not present

## 2022-09-25 DIAGNOSIS — L0201 Cutaneous abscess of face: Secondary | ICD-10-CM | POA: Diagnosis not present

## 2022-09-25 DIAGNOSIS — L720 Epidermal cyst: Secondary | ICD-10-CM | POA: Diagnosis not present

## 2022-09-28 DIAGNOSIS — Z7901 Long term (current) use of anticoagulants: Secondary | ICD-10-CM | POA: Diagnosis not present

## 2022-09-28 DIAGNOSIS — R030 Elevated blood-pressure reading, without diagnosis of hypertension: Secondary | ICD-10-CM | POA: Diagnosis not present

## 2022-09-28 DIAGNOSIS — Z888 Allergy status to other drugs, medicaments and biological substances status: Secondary | ICD-10-CM | POA: Diagnosis not present

## 2022-09-28 DIAGNOSIS — I10 Essential (primary) hypertension: Secondary | ICD-10-CM | POA: Diagnosis not present

## 2022-09-28 DIAGNOSIS — I4811 Longstanding persistent atrial fibrillation: Secondary | ICD-10-CM | POA: Diagnosis not present

## 2022-10-11 ENCOUNTER — Encounter: Payer: Self-pay | Admitting: *Deleted

## 2022-10-11 ENCOUNTER — Encounter: Payer: Self-pay | Admitting: Cardiology

## 2022-10-11 DIAGNOSIS — F4310 Post-traumatic stress disorder, unspecified: Secondary | ICD-10-CM | POA: Insufficient documentation

## 2022-10-11 DIAGNOSIS — E785 Hyperlipidemia, unspecified: Secondary | ICD-10-CM

## 2022-10-11 DIAGNOSIS — G4700 Insomnia, unspecified: Secondary | ICD-10-CM | POA: Insufficient documentation

## 2022-10-11 DIAGNOSIS — F1090 Alcohol use, unspecified, uncomplicated: Secondary | ICD-10-CM

## 2022-10-11 DIAGNOSIS — Z789 Other specified health status: Secondary | ICD-10-CM | POA: Insufficient documentation

## 2022-10-11 HISTORY — DX: Other specified health status: Z78.9

## 2022-10-11 HISTORY — DX: Insomnia, unspecified: G47.00

## 2022-10-11 HISTORY — DX: Post-traumatic stress disorder, unspecified: F43.10

## 2022-10-11 HISTORY — DX: Alcohol use, unspecified, uncomplicated: F10.90

## 2022-10-11 HISTORY — DX: Hyperlipidemia, unspecified: E78.5

## 2022-10-17 ENCOUNTER — Encounter: Payer: Self-pay | Admitting: *Deleted

## 2022-11-10 NOTE — Progress Notes (Signed)
Cardiology Office Note:    Date:  11/13/2022   ID:  Douglas Dudley 1945/10/05, MRN [id number]  PCP:  Douglas Fusi, MD  Cardiologist:  Douglas Herrlich, MD   Referring MD: Douglas Fusi, MD  ASSESSMENT:    1. Persistent atrial fibrillation   2. Coronary artery calcification   3. Aneurysm of ascending aorta without rupture   4. Hypertensive heart disease without heart failure    PLAN:    In order of problems listed above:  Douglas Dudley is back in atrial fibrillation detected [facility] for several months.  He has had previous toxicity with amiodarone he has failed dronedarone he is interested in atrial fibrillation ablation I will refer him to EP and go ahead and discontinue his dronedarone relatively contraindicated in persistent atrial fibrillation continue his anticoagulation Having no anginal discomfort he has had previous normal ischemic evaluation, he will continue his statin Recheck CT of the chest no contrast regarding thoracic aortic aneurysm screen for abdominal present about 20% of these cases. Stable hypertension  Next appointment 3 months   Medication Adjustments/Labs and Tests Ordered: Current medicines are reviewed at length with the patient today.  Concerns regarding medicines are outlined above.  Orders Placed This Encounter  Procedures   CT Chest Wo Contrast   Ambulatory referral to Cardiac Electrophysiology   EKG 12-Lead   ECHOCARDIOGRAM COMPLETE   VAS [place] AAA DUPLEX   No orders of the defined types were placed in this encounter.    Chief complaint and like to reestablish cardiology care  History of Present Illness:    Douglas Dudley is a 77 y.o. male who is being seen today for the evaluation of fibrillation at the request of Douglas Fusi, MD.  He was seen by me 09/21/2016 [facility] cardiology with a history of paroxysmal atrial fibrillation and hyperlipidemia.  At that time he was maintaining sinus rhythm and was  anticoagulated.  He has been followed by [facility] for paroxysmal atrial fibrillation coronary artery calcification and thoracic aortic aneurysm 46 mm ascending aorta.  He had neurotoxicity with amiodarone with gait difficulty.  He has had previous ischemia evaluation normal  He was recently seen [facility] 09/28/2022 referred by the [facility] clinic uncontrolled hypertension.  At that time his blood pressure recorded at 199/86.  CBC was normal hemoglobin 15.3 potassium 4.4 sodium 139 creatinine 0.7 GFR 95 cc/min.  His EKG showed atrial fibrillation incomplete right bundle branch block.  Echocardiogram August 2021 at [facility] showed normal left ventricular size low normal EF 50 to 55% mild left atrial enlargement ascending aorta 46 mmHg.  He tells me that he has been in atrial fibrillation now for about 2 months detected at the [facility]. He does not feel as well when he tries to do activities like gardening work he finds himself fatigued and has to stop and rest and is unhappy with the quality of his life He is not having edema shortness of breath chest pain palpitation or syncope He continues his anticoagulant no bleeding complication Despite being in atrial fibrillation he continues to take Multaq I asked him to stop today He has had 2 previous cardioversions and has recurrence of atrial fibrillation and is on a multivitamin.  He would like to explore atrial fibrillation ablation He is quite concerned about his previous thoracic aortic aneurysm will recheck to CT scan for caliber rarely in his age group is a progressed to correction he has never been screened  for abdominal aortic aneurysm that is present and about 20% of people and will do it in our office.  Will also recheck echocardiogram. Past Medical History:  Diagnosis Date   Acute pain of right knee 05/01/2019   Alcohol use 10/11/2022   Asymptomatic varicose veins 06/30/2020   Atrial fibrillation with RVR  07/10/2016   Chronic anticoagulation 08/07/2016   Chronic sinusitis 06/30/2020   Closed fracture of shaft of humerus 10/18/2015   Formatting of this note might be different from the original. 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 Added automatically from request for surgery [id number] Last Assessment & Plan: Formatting of this note might be different from the original. Longstanding history of right shoulder pain refractory to conservative management.  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 Dr. Deberah Castle   APPENDECTOMY     CARDIOVERSION   08/15/2016   Dr. Dulce Sellar   CARPAL TUNNEL RELEASE Bilateral 1981   CATARACT EXTRACTION Left 07/2020   CATARACT EXTRACTION Right 11/24/2016   FRACTURE SURGERY Left    Arm   HIP SURGERY Left 02/08/2012   Replacement by Doristine Counter, MD @ [facility]   KNEE SURGERY Right    NASAL SINUS SURGERY     PARS PLANA VITRECTOMY Right 07/10/2016   TONSILLECTOMY      Current Medications: Current Meds  Medication Sig   atorvastatin (LIPITOR) 20 MG tablet    brimonidine (ALPHAGAN P) 0.1 % SOLN 1 drop 3 (three) times a day.   Calcium Carbonate-Vitamin D 600-400 MG-UNIT tablet Take 1 tablet by mouth every morning.   Cholecalciferol 50 MCG (2000 UT) CAPS Take 1 capsule by mouth daily as needed.   ketorolac (ACULAR) 0.5 % ophthalmic solution Place 1 drop into the right eye 2 (two) times daily.   QUEtiapine Fumarate (SEROQUEL PO) Take 1 tablet by mouth every evening.   REFRESH TEARS 0.5 % SOLN Place 1 drop into both eyes daily as needed (DRY EYES).   sertraline (ZOLOFT) 100 MG tablet Take by mouth.   XARELTO 20 MG TABS tablet Take 20 mg by mouth daily.   [DISCONTINUED] amiodarone (PACERONE) 200 MG tablet Take 200 mg by mouth daily.   [DISCONTINUED] MULTAQ 400 MG tablet Take 400 mg by mouth 2 (two) times  daily.     Allergies:   Amiodarone and Quetiapine   Social History   Socioeconomic History   Marital status: Married    Spouse name: Not on file   Number of children: Not on file   Years of education: Not on file   Highest education level: Not on file  Occupational History   Not on file  Tobacco Use   Smoking status: Never   Smokeless tobacco: Never  Substance and Sexual Activity   Alcohol use: Yes    Comment: socially   Drug use: No   Sexual activity: Not on file  Other Topics Concern   Not on file  Social History Narrative   Not on file   Social Determinants of Health   Financial Resource Strain: Not on file  Food Insecurity: Not on file  Transportation Needs: Not on  file  Physical Activity: Not on file  Stress: Not on file  Social Connections: Not on file     Family History: The patient's family history is not on file.  ROS:   ROS Please see the history of present illness.     All other systems reviewed and are negative.  EKGs/Labs/Other Studies Reviewed:    The following studies were reviewed today:       EKG:  EKG is  ordered today.  The ekg ordered today is personally reviewed and demonstrates atrial fibrillation rate 54 bpm coarse baseline   Physical Exam:    VS:  BP 126/70 (BP Location: Right Arm, Patient Position: Sitting)   Pulse (!) 54   Ht 6' (1.829 m)   Wt 244 lb 12.8 oz (111 kg)   SpO2 97%   BMI 33.20 kg/m     Wt Readings from Last 3 Encounters:  11/13/22 244 lb 12.8 oz (111 kg)  07/15/15 250 lb (113.4 kg)     GEN:  Well nourished, well developed in no acute distress HEENT: Normal NECK: No JVD; No carotid bruits LYMPHATICS: No lymphadenopathy CARDIAC: Irregular rate and rhythm variable first heart sound  RESPIRATORY:  Clear to auscultation without rales, wheezing or rhonchi  ABDOMEN: Soft, non-tender, non-distended MUSCULOSKELETAL:  No edema; No deformity  SKIN: Warm and dry NEUROLOGIC:  Alert and oriented x 3 PSYCHIATRIC:  Normal affect     Signed, Douglas Herrlich, MD  11/13/2022 8:48 AM    [facility]

## 2022-11-13 ENCOUNTER — Encounter: Payer: Self-pay | Admitting: Cardiology

## 2022-11-13 ENCOUNTER — Ambulatory Visit: Payer: Medicare Other | Attending: Cardiology | Admitting: Cardiology

## 2022-11-13 VITALS — BP 126/70 | HR 54 | Ht 72.0 in | Wt 244.8 lb

## 2022-11-13 DIAGNOSIS — I7121 Aneurysm of the ascending aorta, without rupture: Secondary | ICD-10-CM

## 2022-11-13 DIAGNOSIS — I2584 Coronary atherosclerosis due to calcified coronary lesion: Secondary | ICD-10-CM

## 2022-11-13 DIAGNOSIS — I4819 Other persistent atrial fibrillation: Secondary | ICD-10-CM | POA: Diagnosis not present

## 2022-11-13 DIAGNOSIS — I119 Hypertensive heart disease without heart failure: Secondary | ICD-10-CM

## 2022-11-13 DIAGNOSIS — I2510 Atherosclerotic heart disease of native coronary artery without angina pectoris: Secondary | ICD-10-CM | POA: Diagnosis not present

## 2022-11-13 NOTE — Patient Instructions (Addendum)
Medication Instructions:  Your physician recommends that you continue on your current medications as directed. Please refer to the Current Medication list given to you today.  *If you need a refill on your cardiac medications before your next appointment, please call your pharmacy*   Lab Work: None If you have labs (blood work) drawn today and your tests are completely normal, you will receive your results only by: MyChart Message (if you have MyChart) OR A paper copy in the mail If you have any lab test that is abnormal or we need to change your treatment, we will call you to review the results.   Testing/Procedures: Your physician has requested that you have an echocardiogram. Echocardiography is a painless test that uses sound waves to create images of your heart. It provides your doctor with information about the size and shape of your heart and how well your heart's chambers and valves are working. This procedure takes approximately one hour. There are no restrictions for this procedure. Please do NOT wear cologne, perfume, aftershave, or lotions (deodorant is allowed). Please arrive 15 minutes prior to your appointment time.  Your physician has requested that you have an abdominal aorta duplex. During this test, an ultrasound is used to evaluate the aorta. Allow 30 minutes for this exam. Do not eat after midnight the day before and avoid carbonated beverages  Non-Cardiac CT scanning, (CAT scanning), is a noninvasive, special x-ray that produces cross-sectional images of the body using x-rays and a computer. CT scans help physicians diagnose and treat medical conditions. For some CT exams, a contrast material is used to enhance visibility in the area of the body being studied. CT scans provide greater clarity and reveal more details than regular x-ray exams.     Follow-Up: At [facility], you and your health needs are our priority.  As part of our continuing mission to  provide you with exceptional heart care, we have created designated Provider Care Teams.  These Care Teams include your primary Cardiologist (physician) and Advanced Practice Providers (APPs -  Physician Assistants and Nurse Practitioners) who all work together to provide you with the care you need, when you need it.  We recommend signing up for the patient portal called "MyChart".  Sign up information is provided on this After Visit Summary.  MyChart is used to connect with patients for Virtual Visits (Telemedicine).  Patients are able to view lab/test results, encounter notes, upcoming appointments, etc.  Non-urgent messages can be sent to your provider as well.   To learn more about what you can do with MyChart, go to [email or web address].    Your next appointment:   3 month(s)  Provider:   Norman Herrlich, MD    Other Instructions None

## 2022-11-24 ENCOUNTER — Ambulatory Visit (HOSPITAL_BASED_OUTPATIENT_CLINIC_OR_DEPARTMENT_OTHER): Admission: RE | Admit: 2022-11-24 | Payer: Medicare Other | Source: Ambulatory Visit

## 2022-12-04 ENCOUNTER — Ambulatory Visit (INDEPENDENT_AMBULATORY_CARE_PROVIDER_SITE_OTHER): Payer: Medicare Other

## 2022-12-04 ENCOUNTER — Ambulatory Visit: Payer: Medicare Other | Attending: Cardiology

## 2022-12-04 DIAGNOSIS — I2584 Coronary atherosclerosis due to calcified coronary lesion: Secondary | ICD-10-CM | POA: Diagnosis not present

## 2022-12-04 DIAGNOSIS — I119 Hypertensive heart disease without heart failure: Secondary | ICD-10-CM | POA: Diagnosis not present

## 2022-12-04 DIAGNOSIS — I2510 Atherosclerotic heart disease of native coronary artery without angina pectoris: Secondary | ICD-10-CM

## 2022-12-04 DIAGNOSIS — I7121 Aneurysm of the ascending aorta, without rupture: Secondary | ICD-10-CM

## 2022-12-04 DIAGNOSIS — I4819 Other persistent atrial fibrillation: Secondary | ICD-10-CM

## 2022-12-04 LAB — ECHOCARDIOGRAM COMPLETE
Area-P 1/2: 3.34 cm2
Calc EF: 51.7 %
S' Lateral: 4.5 cm
Single Plane A2C EF: 51.7 %
Single Plane A4C EF: 56.8 %

## 2022-12-05 ENCOUNTER — Ambulatory Visit (HOSPITAL_BASED_OUTPATIENT_CLINIC_OR_DEPARTMENT_OTHER)
Admission: RE | Admit: 2022-12-05 | Discharge: 2022-12-05 | Disposition: A | Discharge status: Home / Self Care | Payer: No Typology Code available for payment source | Source: Ambulatory Visit | Attending: Cardiology | Admitting: Cardiology

## 2022-12-05 DIAGNOSIS — I2510 Atherosclerotic heart disease of native coronary artery without angina pectoris: Secondary | ICD-10-CM | POA: Insufficient documentation

## 2022-12-05 DIAGNOSIS — R918 Other nonspecific abnormal finding of lung field: Secondary | ICD-10-CM | POA: Diagnosis not present

## 2022-12-05 DIAGNOSIS — I7121 Aneurysm of the ascending aorta, without rupture: Secondary | ICD-10-CM | POA: Insufficient documentation

## 2022-12-05 DIAGNOSIS — I119 Hypertensive heart disease without heart failure: Secondary | ICD-10-CM | POA: Diagnosis not present

## 2022-12-05 DIAGNOSIS — I4819 Other persistent atrial fibrillation: Secondary | ICD-10-CM | POA: Diagnosis not present

## 2022-12-05 DIAGNOSIS — I2584 Coronary atherosclerosis due to calcified coronary lesion: Secondary | ICD-10-CM | POA: Insufficient documentation

## 2022-12-06 ENCOUNTER — Other Ambulatory Visit: Payer: Self-pay

## 2022-12-06 DIAGNOSIS — I7121 Aneurysm of the ascending aorta, without rupture: Secondary | ICD-10-CM

## 2022-12-11 ENCOUNTER — Ambulatory Visit: Payer: Medicare Other | Admitting: Cardiology

## 2022-12-12 ENCOUNTER — Telehealth: Payer: Self-pay | Admitting: Cardiology

## 2022-12-12 NOTE — Telephone Encounter (Signed)
Patient returned RN's call regarding results. 

## 2022-12-12 NOTE — Telephone Encounter (Signed)
Left message for the patient to call back.

## 2022-12-12 NOTE — Telephone Encounter (Signed)
Called patient and answered all of his questions. Patient had no further concerns at this time.

## 2022-12-14 ENCOUNTER — Other Ambulatory Visit: Payer: Self-pay

## 2022-12-14 DIAGNOSIS — I7121 Aneurysm of the ascending aorta, without rupture: Secondary | ICD-10-CM

## 2022-12-14 DIAGNOSIS — H3581 Retinal edema: Secondary | ICD-10-CM | POA: Diagnosis not present

## 2022-12-14 DIAGNOSIS — H35371 Puckering of macula, right eye: Secondary | ICD-10-CM | POA: Diagnosis not present

## 2022-12-15 ENCOUNTER — Telehealth (HOSPITAL_BASED_OUTPATIENT_CLINIC_OR_DEPARTMENT_OTHER): Payer: Self-pay

## 2022-12-21 ENCOUNTER — Telehealth: Payer: Self-pay

## 2022-12-21 ENCOUNTER — Telehealth: Payer: Self-pay | Admitting: Cardiology

## 2022-12-21 NOTE — Telephone Encounter (Signed)
Spoke with pt . CT chest results were discussed with pt and a 6 month follow up was recommended by Dr. Dulce Sellar. It was ordered for 6-7 by mistake. Appt cancelled and reordered with a 6 month recall

## 2022-12-21 NOTE — Telephone Encounter (Signed)
Patient is calling regarding his CT scheduled for 06/14. He states he was advised this did not need to be repeated until November due to just having one.   Please advise.

## 2022-12-25 ENCOUNTER — Encounter: Payer: Self-pay | Admitting: Cardiology

## 2022-12-25 ENCOUNTER — Ambulatory Visit: Payer: Medicare Other | Attending: Cardiology | Admitting: Cardiology

## 2022-12-25 VITALS — BP 140/90 | HR 61 | Ht 72.0 in | Wt 242.6 lb

## 2022-12-25 DIAGNOSIS — I7121 Aneurysm of the ascending aorta, without rupture: Secondary | ICD-10-CM | POA: Diagnosis not present

## 2022-12-25 DIAGNOSIS — I4819 Other persistent atrial fibrillation: Secondary | ICD-10-CM | POA: Diagnosis not present

## 2022-12-25 DIAGNOSIS — I10 Essential (primary) hypertension: Secondary | ICD-10-CM

## 2022-12-25 DIAGNOSIS — D6869 Other thrombophilia: Secondary | ICD-10-CM | POA: Diagnosis not present

## 2022-12-25 MED ORDER — PROPAFENONE HCL ER 325 MG PO CP12: 325.0000 mg | ORAL_CAPSULE | Freq: Two times a day (BID) | ORAL | 3 refills | Status: DC

## 2022-12-25 NOTE — H&P (View-Only) (Signed)
 Electrophysiology Office Note   Date:  12/25/2022   ID:  Douglas Dudley, DOB 03/24/1946, MRN [id number]  PCP:  Schultz, Douglas E, MD  Cardiologist:  Munley Primary Electrophysiologist:  Celedonio Sortino Martin Douglas Shaver, MD    Chief Complaint: AF   History of Present Illness: Douglas Dudley is a 77 y.o. male who is being seen today for the evaluation of AF at the request of Munley, Brian J, MD. Presenting today for electrophysiology evaluation.  He has a history significant for atrial fibrillation, hypertension, hyperlipidemia.  He has a thoracic aortic aneurysm of 46 mm followed by general cardiology.  He has had neurotoxicity with amiodarone with gait difficulty.  He has been in atrial fibrillation for few months.  He does not feel well when he tries activities such as gardening.  He feels fatigued and mildly short of breath.  Today, he denies symptoms of palpitations, chest pain, shortness of breath, orthopnea, PND, lower extremity edema, claudication, dizziness, presyncope, syncope, bleeding, or neurologic sequela. The patient is tolerating medications without difficulties.  He feels significant fatigue which he attributes to his atrial fibrillation.  He feels well when he is in sinus rhythm.  He is usually quite active, working around his house and taking care of his wife.   Past Medical History:  Diagnosis Date   Acute pain of right knee 05/01/2019   Alcohol use 10/11/2022   Asymptomatic varicose veins 06/30/2020   Atrial fibrillation with RVR (HCC) 07/10/2016   Chronic anticoagulation 08/07/2016   Chronic sinusitis 06/30/2020   Closed fracture of shaft of humerus 10/18/2015   Formatting of this note might be different from the original. 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 Added automatically from request for surgery [id number] Last Assessment & Plan: Formatting of this note might be different from the original. Longstanding history of right shoulder pain refractory to conservative management.  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 Dr. Yaste   APPENDECTOMY     CARDIOVERSION  08/15/2016   Dr. Munley   CARPAL TUNNEL RELEASE Bilateral 1981   CATARACT EXTRACTION Left 07/2020   CATARACT EXTRACTION Right 11/24/2016   FRACTURE SURGERY Left    Arm   HIP SURGERY Left 02/08/2012   Replacement by David Howe,   MD @ [facility]   KNEE SURGERY Right    NASAL SINUS SURGERY     PARS PLANA VITRECTOMY Right 07/10/2016   TONSILLECTOMY       Current Outpatient Medications  Medication Sig Dispense Refill    Calcium Carbonate-Vitamin D 600-400 MG-UNIT tablet Take 1 tablet by mouth every morning.     ketorolac (ACULAR) 0.5 % ophthalmic solution Place 1 drop into the right eye 2 (two) times daily.     propafenone (RYTHMOL SR) 325 MG 12 hr capsule Take 1 capsule (325 mg total) by mouth 2 (two) times daily. 90 capsule 3   QUEtiapine Fumarate (SEROQUEL PO) Take 1 tablet by mouth every evening.     REFRESH TEARS 0.5 % SOLN Place 1 drop into both eyes daily as needed (DRY EYES).     sertraline (ZOLOFT) 100 MG tablet Take by mouth.     XARELTO 20 MG TABS tablet Take 20 mg by mouth daily.     gabapentin (NEURONTIN) 100 MG capsule Take 100 mg by mouth 3 (three) times daily. (Patient not taking: Reported on 12/25/2022)     No current facility-administered medications for this visit.    Allergies:   Amiodarone and Quetiapine   Social History:  The patient  reports that he has never smoked. He has never used smokeless tobacco. He reports current alcohol use. He reports that he does not use drugs.   Family History:  The patient's Family history is unknown by patient.    ROS:  Please see the history of present illness.   Otherwise, review of systems is positive for none.   All other systems are reviewed and negative.    PHYSICAL EXAM: VS:  BP (!) 140/90   Pulse 61   Ht 6' (1.829 m)   Wt 242 lb 9.6 oz (110 kg)   SpO2 97%   BMI 32.90 kg/m  , BMI Body mass index is 32.9 kg/m. GEN: Well nourished, well developed, in no acute distress  HEENT: normal  Neck: no JVD, carotid bruits, or masses Cardiac: irregular; no murmurs, rubs, or gallops,no edema  Respiratory:  clear to auscultation bilaterally, normal work of breathing GI: soft, nontender, nondistended, + BS MS: no deformity or atrophy  Skin: warm and dry Neuro:  Strength and sensation are intact Psych: euthymic mood, full affect  EKG:  EKG is ordered today. Personal review of the ekg ordered shows atrial fibrillation  Recent Labs: No results  found for requested labs within last 365 days.    Lipid Panel  No results found for: "CHOL", "TRIG", "HDL", "CHOLHDL", "VLDL", "LDLCALC", "LDLDIRECT"   Wt Readings from Last 3 Encounters:  12/25/22 242 lb 9.6 oz (110 kg)  11/13/22 244 lb 12.8 oz (111 kg)  07/15/15 250 lb (113.4 kg)      Other studies Reviewed: Additional studies/ records that were reviewed today include: TTE 12/04/22  Review of the above records today demonstrates:   1. Left ventricular ejection fraction, by estimation, is 60 to 65%. The  left ventricle has normal function. The left ventricle has no regional  wall motion abnormalities.   2. Right ventricular systolic function is normal. The right ventricular  size is normal. There is normal pulmonary artery systolic pressure.   3. Left atrial size was moderately dilated.   4. The mitral valve is normal in structure. Mild mitral valve  regurgitation. No evidence of mitral stenosis.   5. The aortic valve is normal in structure. Aortic valve regurgitation is    not visualized. No aortic stenosis is present.   6. Aneurysm of the ascending aorta, measuring 43 mm.   7. The inferior vena cava is normal in size with greater than 50%  respiratory variability, suggesting right atrial pressure of 3 mmHg.    ASSESSMENT AND PLAN:  1.  Persistent atrial fibrillation: Currently on Xarelto.  CHA2DS2-VASc of 4.  Has had side effects on amiodarone.  Multaq no longer effective.  He would prefer a rhythm control strategy.  Due to that, we Rielle Schlauch plan for ablation.  Risk and benefits have been discussed.  He understands the risks and is agreed to the procedure.  In the interim, we Beryle Bagsby start propafenone 325 mg twice daily and plan for cardioversion as a bridge to ablation.  Risk, benefits, and alternatives to EP study and radiofrequency ablation for afib were also discussed in detail today. These risks include but are not limited to stroke, bleeding, vascular damage, tamponade,  perforation, damage to the esophagus, lungs, and other structures, pulmonary vein stenosis, worsening renal function, and death. The patient understands these risk and wishes to proceed.  We Raeford Brandenburg therefore proceed with catheter ablation at the next available time.  Carto, ICE, anesthesia are requested for the procedure.  Tylan Kinn also obtain CT PV protocol prior to the procedure to exclude LAA thrombus and further evaluate atrial anatomy.   2.  Thoracic aortic aneurysm: CT scan per primary cardiology.  3.  Hypertension: Mildly elevated today.  Usually well-controlled.  4.  Secondary hypercoagulable state: Currently on Xarelto for atrial fibrillation  Case discussed with primary cardiology  Current medicines are reviewed at length with the patient today.   The patient does not have concerns regarding his medicines.  The following changes were made today: Start propafenone  Labs/ tests ordered today include:  Orders Placed This Encounter  Procedures   CT CARDIAC MORPH/PULM VEIN W/CM&W/O CA SCORE   Basic metabolic panel   CBC   EKG 12-Lead     Disposition:   FU with Adelaide Pfefferkorn 6 months  Signed, Eretria Manternach Martin Willys Salvino, MD  12/25/2022 9:53 AM     [facility] [address] [place] [address] [phone] (office) [phone] (fax)  

## 2022-12-25 NOTE — Progress Notes (Signed)
Electrophysiology Office Note   Date:  12/25/2022   ID:  Douglas Dudley, Douglas Dudley 1946-03-27, MRN [id number]  PCP:  Paulina Fusi, MD  Cardiologist:  Dulce Sellar Primary Electrophysiologist:  Sahaj Bona Jorja Loa, MD    Chief Complaint: AF   History of Present Illness: Douglas Dudley is a 77 y.o. male who is being seen today for the evaluation of AF at the request of Dulce Sellar, Iline Oven, MD. Presenting today for electrophysiology evaluation.  He has a history significant for atrial fibrillation, hypertension, hyperlipidemia.  He has a thoracic aortic aneurysm of 46 mm followed by general cardiology.  He has had neurotoxicity with amiodarone with gait difficulty.  He has been in atrial fibrillation for few months.  He does not feel well when he tries activities such as gardening.  He feels fatigued and mildly short of breath.  Today, he denies symptoms of palpitations, chest pain, shortness of breath, orthopnea, PND, lower extremity edema, claudication, dizziness, presyncope, syncope, bleeding, or neurologic sequela. The patient is tolerating medications without difficulties.  He feels significant fatigue which he attributes to his atrial fibrillation.  He feels well when he is in sinus rhythm.  He is usually quite active, working around his house and taking care of his wife.   Past Medical History:  Diagnosis Date   Acute pain of right knee 05/01/2019   Alcohol use 10/11/2022   Asymptomatic varicose veins 06/30/2020   Atrial fibrillation with RVR (HCC) 07/10/2016   Chronic anticoagulation 08/07/2016   Chronic sinusitis 06/30/2020   Closed fracture of shaft of humerus 10/18/2015   Formatting of this note might be different from the original. 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 Added automatically from request for surgery [id number] Last Assessment & Plan: Formatting of this note might be different from the original. Longstanding history of right shoulder pain refractory to conservative management.  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 Dr. Deberah Castle   APPENDECTOMY     CARDIOVERSION  08/15/2016   Dr. Dulce Sellar   CARPAL TUNNEL RELEASE Bilateral 1981   CATARACT EXTRACTION Left 07/2020   CATARACT EXTRACTION Right 11/24/2016   FRACTURE SURGERY Left    Arm   HIP SURGERY Left 02/08/2012   Replacement by Doristine Counter,  MD @ [facility]   KNEE SURGERY Right    NASAL SINUS SURGERY     PARS PLANA VITRECTOMY Right 07/10/2016   TONSILLECTOMY       Current Outpatient Medications  Medication Sig Dispense Refill    Calcium Carbonate-Vitamin D 600-400 MG-UNIT tablet Take 1 tablet by mouth every morning.     ketorolac (ACULAR) 0.5 % ophthalmic solution Place 1 drop into the right eye 2 (two) times daily.     propafenone (RYTHMOL SR) 325 MG 12 hr capsule Take 1 capsule (325 mg total) by mouth 2 (two) times daily. 90 capsule 3   QUEtiapine Fumarate (SEROQUEL PO) Take 1 tablet by mouth every evening.     REFRESH TEARS 0.5 % SOLN Place 1 drop into both eyes daily as needed (DRY EYES).     sertraline (ZOLOFT) 100 MG tablet Take by mouth.     XARELTO 20 MG TABS tablet Take 20 mg by mouth daily.     gabapentin (NEURONTIN) 100 MG capsule Take 100 mg by mouth 3 (three) times daily. (Patient not taking: Reported on 12/25/2022)     No current facility-administered medications for this visit.    Allergies:   Amiodarone and Quetiapine   Social History:  The patient  reports that he has never smoked. He has never used smokeless tobacco. He reports current alcohol use. He reports that he does not use drugs.   Family History:  The patient's Family history is unknown by patient.    ROS:  Please see the history of present illness.   Otherwise, review of systems is positive for none.   All other systems are reviewed and negative.    PHYSICAL EXAM: VS:  BP (!) 140/90   Pulse 61   Ht 6' (1.829 m)   Wt 242 lb 9.6 oz (110 kg)   SpO2 97%   BMI 32.90 kg/m  , BMI Body mass index is 32.9 kg/m. GEN: Well nourished, well developed, in no acute distress  HEENT: normal  Neck: no JVD, carotid bruits, or masses Cardiac: irregular; no murmurs, rubs, or gallops,no edema  Respiratory:  clear to auscultation bilaterally, normal work of breathing GI: soft, nontender, nondistended, + BS MS: no deformity or atrophy  Skin: warm and dry Neuro:  Strength and sensation are intact Psych: euthymic mood, full affect  EKG:  EKG is ordered today. Personal review of the ekg ordered shows atrial fibrillation  Recent Labs: No results  found for requested labs within last 365 days.    Lipid Panel  No results found for: "CHOL", "TRIG", "HDL", "CHOLHDL", "VLDL", "LDLCALC", "LDLDIRECT"   Wt Readings from Last 3 Encounters:  12/25/22 242 lb 9.6 oz (110 kg)  11/13/22 244 lb 12.8 oz (111 kg)  07/15/15 250 lb (113.4 kg)      Other studies Reviewed: Additional studies/ records that were reviewed today include: TTE 12/04/22  Review of the above records today demonstrates:   1. Left ventricular ejection fraction, by estimation, is 60 to 65%. The  left ventricle has normal function. The left ventricle has no regional  wall motion abnormalities.   2. Right ventricular systolic function is normal. The right ventricular  size is normal. There is normal pulmonary artery systolic pressure.   3. Left atrial size was moderately dilated.   4. The mitral valve is normal in structure. Mild mitral valve  regurgitation. No evidence of mitral stenosis.   5. The aortic valve is normal in structure. Aortic valve regurgitation is  not visualized. No aortic stenosis is present.   6. Aneurysm of the ascending aorta, measuring 43 mm.   7. The inferior vena cava is normal in size with greater than 50%  respiratory variability, suggesting right atrial pressure of 3 mmHg.    ASSESSMENT AND PLAN:  1.  Persistent atrial fibrillation: Currently on Xarelto.  CHA2DS2-VASc of 4.  Has had side effects on amiodarone.  Multaq no longer effective.  He would prefer a rhythm control strategy.  Due to that, we Tiphani Mells plan for ablation.  Risk and benefits have been discussed.  He understands the risks and is agreed to the procedure.  In the interim, we Xan Ingraham start propafenone 325 mg twice daily and plan for cardioversion as a bridge to ablation.  Risk, benefits, and alternatives to EP study and radiofrequency ablation for afib were also discussed in detail today. These risks include but are not limited to stroke, bleeding, vascular damage, tamponade,  perforation, damage to the esophagus, lungs, and other structures, pulmonary vein stenosis, worsening renal function, and death. The patient understands these risk and wishes to proceed.  We Kabeer Hoagland therefore proceed with catheter ablation at the next available time.  Carto, ICE, anesthesia are requested for the procedure.  Zyrion Coey also obtain CT PV protocol prior to the procedure to exclude LAA thrombus and further evaluate atrial anatomy.   2.  Thoracic aortic aneurysm: CT scan per primary cardiology.  3.  Hypertension: Mildly elevated today.  Usually well-controlled.  4.  Secondary hypercoagulable state: Currently on Xarelto for atrial fibrillation  Case discussed with primary cardiology  Current medicines are reviewed at length with the patient today.   The patient does not have concerns regarding his medicines.  The following changes were made today: Start propafenone  Labs/ tests ordered today include:  Orders Placed This Encounter  Procedures   CT CARDIAC MORPH/PULM VEIN W/CM&W/O CA SCORE   Basic metabolic panel   CBC   EKG 12-Lead     Disposition:   FU with Kihanna Kamiya 6 months  Signed, Letzy Gullickson Jorja Loa, MD  12/25/2022 9:53 AM     [facility] [address] [place] [address] [phone] (office) [phone] (fax)

## 2022-12-25 NOTE — Patient Instructions (Addendum)
Medication Instructions:  Your physician has recommended you make the following change in your medication:  1) START taking Rythmol (propafenone) 325 mg daily  *If you need a refill on your cardiac medications before your next appointment, please call your pharmacy*  Testing/Procedures: Your physician has recommended that you have a Cardioversion (DCCV). Electrical Cardioversion uses a jolt of electricity to your heart either through paddles or wired patches attached to your chest. This is a controlled, usually prescheduled, procedure. Defibrillation is done under light anesthesia in the hospital, and you usually go home the day of the procedure. This is done to get your heart back into a normal rhythm. You are not awake for the procedure. Please see the instruction sheet given to you today.  Your physician has requested that you have cardiac CT. Cardiac computed tomography (CT) is a painless test that uses an x-ray machine to take clear, detailed pictures of your heart. For further information please visit [email or web address] This will be done about one week prior to your ablation. We will call you to schedule.   Your physician has recommended that you have an ablation. Catheter ablation is a medical procedure used to treat some cardiac arrhythmias (irregular heartbeats). During catheter ablation, a long, thin, flexible tube is put into a blood vessel in your groin (upper thigh), or neck. This tube is called an ablation catheter. It is then guided to your heart through the blood vessel. Radio frequency waves destroy small areas of heart tissue where abnormal heartbeats may cause an arrhythmia to start. Please see the instruction sheet given to you today. You are scheduled for Atrial Fibrillation Ablation on Thursday, October 10 with Dr. Loman Brooklyn.Please arrive at the Main Entrance A at [facility]: [address] [place], [place] [address] at 5:30 AM   Follow-Up: At [facility],  you and your health needs are our priority.  As part of our continuing mission to provide you with exceptional heart care, we have created designated Provider Care Teams.  These Care Teams include your primary Cardiologist (physician) and Advanced Practice Providers (APPs -  Physician Assistants and Nurse Practitioners) who all work together to provide you with the care you need, when you need it.  Your next appointment:   We will call you to arrange your follow up appointments.     Dear Douglas Dudley  You are scheduled for a Cardioversion on Monday, June 17 with Dr. Mayford Knife.  Please arrive at the [facility] (Main Entrance A) at [facility]: [address] [place], [place] [address] at 6:00am (This time is 1 hour(s) before your procedure to ensure your preparation). Free valet parking service is available. You will check in at ADMITTING. The support person will be asked to wait in the waiting room.  It is OK to have someone drop you off and come back when you are ready to be discharged.     DIET:  Nothing to eat or drink after midnight except a sip of water with medications (see medication instructions below)  MEDICATION INSTRUCTIONS: !!IF ANY NEW MEDICATIONS ARE STARTED AFTER TODAY, PLEASE NOTIFY YOUR PROVIDER AS SOON AS POSSIBLE!!  FYI: Medications such as Semaglutide (Ozempic, [place]), Tirzepatide (Mounjaro, Zepbound), Dulaglutide (Trulicity), etc ("GLP1 agonists") must be held around the time of a procedure. Talk to your provider if you take one of these.  Continue taking your anticoagulant (blood thinner): Rivaroxaban (Xarelto).  You will need to continue this after your procedure until you are told  by your provider that it is safe to stop.    LABS: Your labs will be done at the hospital prior to your procedure - you will need to arrive 1 and 1/2 hours prior to your procedure.  FYI:  For your safety, and to allow [place] to monitor your vital signs accurately during the  surgery/procedure we request: If you have artificial nails, gel coating, SNS etc, please have those removed prior to your surgery/procedure. Not having the nail coverings /polish removed may result in cancellation or delay of your surgery/procedure.  You must have a responsible person to drive you home and stay in the waiting area during your procedure. Failure to do so could result in cancellation.  Bring your insurance cards.  *Special Note: Every effort is made to have your procedure done on time. Occasionally there are emergencies that occur at the hospital that may cause delays. Please be patient if a delay does occur.

## 2022-12-27 ENCOUNTER — Telehealth: Payer: Self-pay | Admitting: Cardiology

## 2022-12-27 NOTE — Telephone Encounter (Signed)
Patient came by office today to make Dr. Elberta Fortis aware that he is having a procedure to remove a tooth on 02/26/23. Patient stated he is also scheduled for an ablation on 05/03/23.

## 2022-12-29 ENCOUNTER — Ambulatory Visit (HOSPITAL_BASED_OUTPATIENT_CLINIC_OR_DEPARTMENT_OTHER): Payer: Medicare Other

## 2023-01-05 ENCOUNTER — Other Ambulatory Visit (HOSPITAL_BASED_OUTPATIENT_CLINIC_OR_DEPARTMENT_OTHER): Payer: Medicare Other

## 2023-01-05 ENCOUNTER — Telehealth: Payer: Self-pay | Admitting: *Deleted

## 2023-01-05 MED ORDER — PROPAFENONE HCL 300 MG PO TABS: 300.0000 mg | ORAL_TABLET | Freq: Two times a day (BID) | ORAL | 6 refills | Status: DC

## 2023-01-05 NOTE — Telephone Encounter (Signed)
Received a call from [facility] office staff on Wednesday letting us know pt stopped by their office that day to report [facility] Propafenone for 300 mg BID. Discussed with Dr. Elberta Fortis that day who agreed 300 mg is ok. Called pt today and made aware. He appreciates my follow up call and agreeable to plan.

## 2023-01-05 NOTE — Progress Notes (Signed)
Instructed patient on following items: Arrival time: 0630 NPO after MN... Bp and anticoagulation meds ok with sips of water AM of procedure Responsible party to stay home after procedure and for 24hrs Have you missed any does of anticoagulant? Pt on xarelto, verbalizes understanding of instructions.

## 2023-01-07 NOTE — Anesthesia Preprocedure Evaluation (Signed)
Anesthesia Evaluation  Patient identified by MRN, date of birth, ID band Patient awake    Reviewed: Allergy & Precautions, NPO status , Patient's Chart, lab work & pertinent test results  History of Anesthesia Complications Negative for: history of anesthetic complications  Airway Mallampati: III  TM Distance: >3 FB Neck ROM: Full    Dental no notable dental hx. (+) Dental Advisory Given   Pulmonary neg pulmonary ROS   Pulmonary exam normal        Cardiovascular hypertension, + dysrhythmias Atrial Fibrillation  Rhythm:Irregular Rate:Normal  TTE  5/24 IMPRESSIONS      1. Left ventricular ejection fraction, by estimation, is 60 to 65%. The left ventricle has normal function. The left ventricle has no regional wall motion abnormalities.  2. Right ventricular systolic function is normal. The right ventricular size is normal. There is normal pulmonary artery systolic pressure.  3. Left atrial size was moderately dilated.  4. The mitral valve is normal in structure. Mild mitral valve regurgitation. No evidence of mitral stenosis.  5. The aortic valve is normal in structure. Aortic valve regurgitation is not visualized. No aortic stenosis is present.  6. Aneurysm of the ascending aorta, measuring 43 mm.  7. The inferior vena cava is normal in size with greater than 50% respiratory variability, suggesting right atrial pressure of 3 mmHg.      Neuro/Psych  PSYCHIATRIC DISORDERS Anxiety Depression    negative neurological ROS     GI/Hepatic Neg liver ROS,GERD  ,,  Endo/Other  negative endocrine ROS    Renal/GU negative Renal ROS     Musculoskeletal negative musculoskeletal ROS (+)    Abdominal   Peds  Hematology negative hematology ROS (+)   Anesthesia Other Findings   Reproductive/Obstetrics                             Anesthesia Physical Anesthesia Plan  ASA: 3  Anesthesia Plan: General    Post-op Pain Management: Minimal or no pain anticipated   Induction: Intravenous  PONV Risk Score and Plan: 2 and Propofol infusion  Airway Management Planned: Mask  Additional Equipment:   Intra-op Plan:   Post-operative Plan:   Informed Consent: I have reviewed the patients History and Physical, chart, labs and discussed the procedure including the risks, benefits and alternatives for the proposed anesthesia with the patient or authorized representative who has indicated his/her understanding and acceptance.     Dental advisory given  Plan Discussed with: Anesthesiologist and CRNA  Anesthesia Plan Comments:        Anesthesia Quick Evaluation

## 2023-01-08 ENCOUNTER — Telehealth: Payer: Self-pay | Admitting: Cardiology

## 2023-01-08 ENCOUNTER — Ambulatory Visit (HOSPITAL_COMMUNITY)
Admission: RE | Admit: 2023-01-08 | Discharge: 2023-01-08 | Disposition: A | Discharge status: Home / Self Care | Payer: No Typology Code available for payment source | Attending: Cardiology | Admitting: Cardiology

## 2023-01-08 ENCOUNTER — Encounter (HOSPITAL_COMMUNITY)
Admission: RE | Disposition: A | Discharge status: Home / Self Care | Payer: Self-pay | Source: Home / Self Care | Attending: Cardiology

## 2023-01-08 ENCOUNTER — Ambulatory Visit (HOSPITAL_BASED_OUTPATIENT_CLINIC_OR_DEPARTMENT_OTHER): Payer: No Typology Code available for payment source | Admitting: Certified Registered Nurse Anesthetist

## 2023-01-08 ENCOUNTER — Other Ambulatory Visit: Payer: Self-pay

## 2023-01-08 ENCOUNTER — Ambulatory Visit (HOSPITAL_COMMUNITY): Payer: No Typology Code available for payment source | Admitting: Certified Registered Nurse Anesthetist

## 2023-01-08 DIAGNOSIS — I712 Thoracic aortic aneurysm, without rupture, unspecified: Secondary | ICD-10-CM | POA: Diagnosis not present

## 2023-01-08 DIAGNOSIS — I10 Essential (primary) hypertension: Secondary | ICD-10-CM | POA: Insufficient documentation

## 2023-01-08 DIAGNOSIS — I119 Hypertensive heart disease without heart failure: Secondary | ICD-10-CM

## 2023-01-08 DIAGNOSIS — Z79899 Other long term (current) drug therapy: Secondary | ICD-10-CM | POA: Diagnosis not present

## 2023-01-08 DIAGNOSIS — Z01818 Encounter for other preprocedural examination: Secondary | ICD-10-CM

## 2023-01-08 DIAGNOSIS — I4891 Unspecified atrial fibrillation: Secondary | ICD-10-CM | POA: Diagnosis not present

## 2023-01-08 DIAGNOSIS — Z7901 Long term (current) use of anticoagulants: Secondary | ICD-10-CM | POA: Insufficient documentation

## 2023-01-08 DIAGNOSIS — I4819 Other persistent atrial fibrillation: Secondary | ICD-10-CM

## 2023-01-08 DIAGNOSIS — F418 Other specified anxiety disorders: Secondary | ICD-10-CM | POA: Diagnosis not present

## 2023-01-08 DIAGNOSIS — D6869 Other thrombophilia: Secondary | ICD-10-CM | POA: Diagnosis not present

## 2023-01-08 DIAGNOSIS — I340 Nonrheumatic mitral (valve) insufficiency: Secondary | ICD-10-CM

## 2023-01-08 HISTORY — PX: CARDIOVERSION: SHX1299

## 2023-01-08 HISTORY — DX: Other persistent atrial fibrillation: I48.19

## 2023-01-08 LAB — POCT I-STAT, CHEM 8
BUN: 12 mg/dL (ref 8–23)
Calcium, Ion: 1.41 mmol/L — ABNORMAL HIGH (ref 1.15–1.40)
Chloride: 105 mmol/L (ref 98–111)
Creatinine, Ser: 0.9 mg/dL (ref 0.61–1.24)
Glucose, Bld: 93 mg/dL (ref 70–99)
HCT: 45 % (ref 39.0–52.0)
Hemoglobin: 15.3 g/dL (ref 13.0–17.0)
Potassium: 4.3 mmol/L (ref 3.5–5.1)
Sodium: 145 mmol/L (ref 135–145)
TCO2: 26 mmol/L (ref 22–32)

## 2023-01-08 SURGERY — CARDIOVERSION
Anesthesia: General

## 2023-01-08 MED ORDER — SODIUM CHLORIDE 0.9 % IV SOLN: INTRAVENOUS | Status: DC

## 2023-01-08 MED ORDER — PROPOFOL 10 MG/ML IV BOLUS
INTRAVENOUS | Status: DC | PRN
  Administered 2023-01-08: 70 mg via INTRAVENOUS
  Administered 2023-01-08: 30 mg via INTRAVENOUS

## 2023-01-08 MED ORDER — LIDOCAINE 2% (20 MG/ML) 5 ML SYRINGE
INTRAMUSCULAR | Status: DC | PRN
  Administered 2023-01-08: 25 mg via INTRAVENOUS

## 2023-01-08 SURGICAL SUPPLY — 1 items: ELECT DEFIB PAD ADLT CADENCE (PAD) ×1 IMPLANT

## 2023-01-08 NOTE — Interval H&P Note (Signed)
History and Physical Interval Note:  01/08/2023 7:35 AM  Douglas Dudley  has presented today for surgery, with the diagnosis of AFIB.  The various methods of treatment have been discussed with the patient and family. After consideration of risks, benefits and other options for treatment, the patient has consented to  Procedure(s): CARDIOVERSION (N/A) as a surgical intervention.  The patient's history has been reviewed, patient examined, no change in status, stable for surgery.  I have reviewed the patient's chart and labs.  Questions were answered to the patient's satisfaction.    Patient here today for DCCV as stated in #1 of Dr. Elberta Fortis note.    Informed Consent   Shared Decision Making/Informed Consent The risks (stroke, cardiac arrhythmias rarely resulting in the need for a temporary or permanent pacemaker, skin irritation or burns and complications associated with conscious sedation including aspiration, arrhythmia, respiratory failure and death), benefits (restoration of normal sinus rhythm) and alternatives of a direct current cardioversion were explained in detail to [name] and he agrees to proceed.      Armanda Magic

## 2023-01-08 NOTE — Transfer of Care (Signed)
Immediate Anesthesia Transfer of Care Note  Patient: XZADRIAN MILHOAN  Procedure(s) Performed: CARDIOVERSION  Patient Location: Cath Lab  Anesthesia Type:General  Level of Consciousness: drowsy, patient cooperative, and responds to stimulation  Airway & Oxygen Therapy: Patient Spontanous Breathing and Patient connected to nasal cannula oxygen  Post-op Assessment: Report given to RN, Post -op Vital signs reviewed and stable, Patient moving all extremities X 4, and Patient able to stick tongue midline  Post vital signs: Reviewed  Last Vitals:  Vitals Value Taken Time  BP 144/96   Temp 36.8   Pulse 67   Resp 15   SpO2 96     Last Pain:  Vitals:   01/08/23 0721  TempSrc:   PainSc: 0-No pain         Complications: No notable events documented.

## 2023-01-08 NOTE — Telephone Encounter (Signed)
Patient would like to talk to "Douglas Dudley" about moving up his cath from October  Sending to [facility] team  Best number [phone]

## 2023-01-08 NOTE — Anesthesia Postprocedure Evaluation (Addendum)
Anesthesia Post Note  Patient: Douglas Dudley  Procedure(s) Performed: CARDIOVERSION     Patient location during evaluation: Cath Lab Anesthesia Type: General Level of consciousness: sedated Pain management: pain level controlled Vital Signs Assessment: post-procedure vital signs reviewed and stable Respiratory status: spontaneous breathing and respiratory function stable Cardiovascular status: stable Postop Assessment: no apparent nausea or vomiting Anesthetic complications: no  No notable events documented.  Last Vitals:  Vitals:   01/08/23 0810 01/08/23 0815  BP: (!) 136/97 (!) 143/93  Pulse: (!) 57 (!) 57  Resp: 17 15  Temp:    SpO2: 96% 95%    Last Pain:  Vitals:   01/08/23 0801  TempSrc:   PainSc: 0-No pain                 [name] DANIEL

## 2023-01-08 NOTE — Progress Notes (Signed)
Discharge instructions reviewed w/patient and copy given to him to take home.

## 2023-01-08 NOTE — CV Procedure (Signed)
    Electrical Cardioversion Procedure Note Douglas Dudley [id number] [birth year]  Procedure: Electrical Cardioversion Indications:  Atrial Fibrillation  Time Out: Verified patient identification, verified procedure,medications/allergies/relevent history reviewed, required imaging and test results available.  Performed  Procedure Details  The patient was NPO after midnight. Anesthesia was administered at the beside  by Dr.Singer with 150mg  of propofol.  Cardioversion was done with synchronized biphasic defibrillation with AP pads with 150watts.  The patient converted to atrial flutter. Cardioversion was done with synchronized biphasic defibrillation with AP pads with 200watts.  The patient failed to convert to sinus rhythm cardioversion was done with synchronized biphasic defibrillation with AP pads with 200watts and pressure on the pads.  The patient failed to convert to sinus rhythm the patient tolerated the procedure well   IMPRESSION:  Unsuccessful cardioversion of atrial fibrillation    Douglas Dudley 01/08/2023, 7:36 AM

## 2023-01-08 NOTE — Addendum Note (Signed)
Addendum  created 01/08/23 0934 by Heather Roberts, MD   Clinical Note Signed

## 2023-01-09 ENCOUNTER — Encounter (HOSPITAL_COMMUNITY): Payer: Self-pay | Admitting: Cardiology

## 2023-01-09 NOTE — Telephone Encounter (Signed)
LM advising pt that Dr. Elberta Fortis does not have anything sooner at this time.

## 2023-01-18 ENCOUNTER — Telehealth: Payer: Self-pay | Admitting: Cardiology

## 2023-01-18 NOTE — Telephone Encounter (Signed)
Recommendations reviewed with pt as per Dr. Munley's note.  Pt verbalized understanding and had no additional questions.  

## 2023-01-18 NOTE — Telephone Encounter (Signed)
Patient dropped off report he would like for Dr. Dulce Sellar to review and respond. Patient also states his medication is "killing him". When asked which medication patient replied "propafenone (RYTHMOL) 300 MG tablet [[id number]] ". Patient would like to know if there are any substitutions as he has " never had any medications cause this reaction".  Best number to call [phone]

## 2023-01-18 NOTE — Telephone Encounter (Signed)
Pt reports that his Rythmol is causing constipation, decreased urination, metallic taste and difficulty sleeping. Pt states he doesn't feel well anymore. I advised the pt that I would forward the message to Dr. Elberta Fortis team. Please advise.

## 2023-01-18 NOTE — Telephone Encounter (Signed)
Pt dropped his echo report off for Dr. Dulce Sellar to review as it was done at the [place]. Richard has the report.

## 2023-01-23 ENCOUNTER — Telehealth: Payer: Self-pay | Admitting: Cardiology

## 2023-01-23 MED ORDER — FLECAINIDE ACETATE 50 MG PO TABS: 50.0000 mg | ORAL_TABLET | Freq: Two times a day (BID) | ORAL | 3 refills | Status: DC

## 2023-01-23 NOTE — Telephone Encounter (Signed)
Propafenone has 8% reported incidence of constipation. Med isn't known to cause decreased urinary output, would advise him to stay well hydrated.   Would defer to MD for input if there is a different anti-arrhythmic that would be acceptable to try instead vs if pt should just treat his constipation with OTC meds and stay on propafenone.

## 2023-01-23 NOTE — Telephone Encounter (Signed)
Pt informed Dr. Elberta Fortis recommends stopping Propafenone and starting  Flecainide 50 mg BID. Advised to call if SE occur after medication change. Patient verbalized understanding and agreeable to plan.

## 2023-01-23 NOTE — Telephone Encounter (Signed)
Pt c/o medication issue:  1. Name of Medication: propafenone (RYTHMOL) 300 MG tablet   2. How are you currently taking this medication (dosage and times per day)? Take 1 tablet (300 mg total) by mouth in the morning and at bedtime.   3. Are you having a reaction (difficulty breathing--STAT)? No   4. What is your medication issue? The medication is causing the patient to have an irregular bowel movement

## 2023-01-23 NOTE — Telephone Encounter (Signed)
Pt reports since starting Propafenone he has noticed a change in bowel movements and urinary output. States he used to have normal daily movement and knew/could tell when it was coming, but now he is having to force a bowel movement. He is also experiencing decreased urinary output.  Aware forwarding to Dr. Elberta Fortis and pharmD for review/advisement and will call him back this week. He is holding Propafenone for now.

## 2023-01-24 ENCOUNTER — Telehealth: Payer: Self-pay | Admitting: Cardiology

## 2023-01-24 MED ORDER — FLECAINIDE ACETATE 50 MG PO TABS: 50.0000 mg | ORAL_TABLET | Freq: Two times a day (BID) | ORAL | 3 refills | Status: DC

## 2023-01-24 NOTE — Telephone Encounter (Signed)
Pt's medication was sent to pt's pharmacy as requested. Confirmation received.  °

## 2023-01-24 NOTE — Telephone Encounter (Signed)
*  STAT* If patient is at the pharmacy, call can be transferred to refill team.   1. Which medications need to be  refilled? (please list name of each medication and dose if known)   flecainide (TAMBOCOR) 50 MG tablet    2. Which pharmacy/location (including street and city if local pharmacy) is medication to be sent to?[facility] - [place], [place] - [address]   3. Do they need a 30 day or 90 day supply? 30 day   Pt states that previous refill request to [place], he is not able to get. He would like it sent to above pharmacy instead

## 2023-01-29 ENCOUNTER — Other Ambulatory Visit: Payer: Self-pay

## 2023-01-29 ENCOUNTER — Telehealth: Payer: Self-pay | Admitting: Cardiology

## 2023-01-29 DIAGNOSIS — M25552 Pain in left hip: Secondary | ICD-10-CM | POA: Diagnosis not present

## 2023-01-29 DIAGNOSIS — M7062 Trochanteric bursitis, left hip: Secondary | ICD-10-CM | POA: Diagnosis not present

## 2023-01-29 NOTE — Telephone Encounter (Signed)
Called patient and he reported that he wanted to cancel his ablation because he has a lot of other stuff going on. Patient was confused as to what his procedure was called, he is having an ablation not a cardiac cath. Patient's ablation is not scheduled until 05/03/23 per the patient. He wanted to cancel his ablation and after speaking with him he agreed to wait until it got closer to his ablation to see whether or not he needed to move his ablation or cancel it. Patient had no further questions at this time.

## 2023-01-29 NOTE — Telephone Encounter (Signed)
Patient would like to cancel his cath.   Please call patient at [phone]

## 2023-02-03 DIAGNOSIS — E785 Hyperlipidemia, unspecified: Secondary | ICD-10-CM | POA: Diagnosis not present

## 2023-02-03 DIAGNOSIS — I7121 Aneurysm of the ascending aorta, without rupture: Secondary | ICD-10-CM | POA: Diagnosis not present

## 2023-02-03 DIAGNOSIS — Z79899 Other long term (current) drug therapy: Secondary | ICD-10-CM | POA: Diagnosis not present

## 2023-02-03 DIAGNOSIS — I480 Paroxysmal atrial fibrillation: Secondary | ICD-10-CM | POA: Diagnosis not present

## 2023-02-03 DIAGNOSIS — E559 Vitamin D deficiency, unspecified: Secondary | ICD-10-CM | POA: Diagnosis not present

## 2023-02-12 DIAGNOSIS — Z7729 Contact with and (suspected ) exposure to other hazardous substances: Secondary | ICD-10-CM | POA: Insufficient documentation

## 2023-02-12 DIAGNOSIS — R195 Other fecal abnormalities: Secondary | ICD-10-CM

## 2023-02-12 DIAGNOSIS — R609 Edema, unspecified: Secondary | ICD-10-CM

## 2023-02-12 DIAGNOSIS — K0381 Cracked tooth: Secondary | ICD-10-CM | POA: Insufficient documentation

## 2023-02-12 DIAGNOSIS — L570 Actinic keratosis: Secondary | ICD-10-CM

## 2023-02-12 DIAGNOSIS — S2341XA Sprain of ribs, initial encounter: Secondary | ICD-10-CM

## 2023-02-12 DIAGNOSIS — K036 Deposits [accretions] on teeth: Secondary | ICD-10-CM | POA: Insufficient documentation

## 2023-02-12 DIAGNOSIS — M5030 Other cervical disc degeneration, unspecified cervical region: Secondary | ICD-10-CM | POA: Insufficient documentation

## 2023-02-12 DIAGNOSIS — M5136 Other intervertebral disc degeneration, lumbar region: Secondary | ICD-10-CM | POA: Insufficient documentation

## 2023-02-12 DIAGNOSIS — S29012A Strain of muscle and tendon of back wall of thorax, initial encounter: Secondary | ICD-10-CM | POA: Insufficient documentation

## 2023-02-12 DIAGNOSIS — M51369 Other intervertebral disc degeneration, lumbar region without mention of lumbar back pain or lower extremity pain: Secondary | ICD-10-CM | POA: Insufficient documentation

## 2023-02-12 DIAGNOSIS — R1030 Lower abdominal pain, unspecified: Secondary | ICD-10-CM | POA: Insufficient documentation

## 2023-02-12 DIAGNOSIS — D126 Benign neoplasm of colon, unspecified: Secondary | ICD-10-CM | POA: Insufficient documentation

## 2023-02-12 HISTORY — DX: Other cervical disc degeneration, unspecified cervical region: M50.30

## 2023-02-12 HISTORY — DX: Cracked tooth: K03.81

## 2023-02-12 HISTORY — DX: Lower abdominal pain, unspecified: R10.30

## 2023-02-12 HISTORY — DX: Actinic keratosis: L57.0

## 2023-02-12 HISTORY — DX: Other fecal abnormalities: R19.5

## 2023-02-12 HISTORY — DX: Strain of muscle and tendon of back wall of thorax, initial encounter: S29.012A

## 2023-02-12 HISTORY — DX: Contact with and (suspected) exposure to other hazardous substances: Z77.29

## 2023-02-12 HISTORY — DX: Other intervertebral disc degeneration, lumbar region without mention of lumbar back pain or lower extremity pain: M51.369

## 2023-02-12 HISTORY — DX: Deposits (accretions) on teeth: K03.6

## 2023-02-12 HISTORY — DX: Sprain of ribs, initial encounter: S23.41XA

## 2023-02-12 HISTORY — DX: Edema, unspecified: R60.9

## 2023-02-12 HISTORY — DX: Benign neoplasm of colon, unspecified: D12.6

## 2023-02-13 NOTE — Progress Notes (Signed)
Cardiology Office Note:    Date:  02/14/2023   ID:  Douglas Dudley, DOB [birth year], MRN [id number]  PCP:  Paulina Fusi, MD  Cardiologist:  Norman Herrlich, MD    Referring MD: Paulina Fusi, MD    ASSESSMENT:    1. Persistent atrial fibrillation (HCC)   2. Secondary hypercoagulable state (HCC)   3. Essential hypertension   4. Aneurysm of ascending aorta without rupture (HCC)    PLAN:    In order of problems listed above:  Douglas Dudley has rate controlled longstanding persistent atrial fibrillation discontinue flecainide continue anticoagulant and he will purchase the consumer wearable watch [employer] to monitor heart rhythm Stable currently not on antihypertensive agents Needs a follow-up CT of the chest regarding aortic aneurysm January 2025   Next appointment: 1 year   Medication Adjustments/Labs and Tests Ordered: Current medicines are reviewed at length with the patient today.  Concerns regarding medicines are outlined above.  Orders Placed This Encounter  Procedures   EKG 12-Lead   No orders of the defined types were placed in this encounter.    History of Present Illness:    Douglas Dudley is a 77 y.o. male with a hx of atrial fibrillation paroxysmal hyperlipidemia and aneurysm ascending aorta last seen 11/13/2022.  He has been referred for EP catheter ablation is scheduled 05/03/2023.  He had an attempted cardioversion unsuccessful 01/08/2023.  CT of chest reported 12/08/2022 with ascending aortic aneurysm 4.6 cm increased from 4.1 cm in November 2018  Compliance with diet, lifestyle and medications: yes  Remains quite vigorous active no cardiovascular symptoms of edema shortness of breath palpitation chest pain exercise intolerance or syncope tolerates his anticoagulant is decided to remain in atrial fibrillation Rate is well-controlled we will stop flecainide continue anticoagulant He is going to purchase a [employer] watch to monitor his heart rate Labs are  followed predominantly through the [facility].  Recent labs with his primary care physician shows a normal CBC 02/05/2023 hemoglobin 14.7 renal function GFR 93 cc potassium 4.3 lipid profile cholesterol 118 LDL 51.   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   Edema, unspecified 02/12/2023   Esophageal reflux 06/30/2020   Essential hypertension 06/30/2020   Exposure to potentially hazardous substance 02/12/2023   Flatulence, eructation and gas pain 06/30/2020   Hammer toe of right foot 10/06/2014   Description:     Hip pain, right 01/19/2015   Description:   Description:   Description:     History of colonic polyps 06/30/2020  Hyperlipidemia 10/11/2022   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 Added automatically from request for surgery [id number] Last Assessment & Plan: Formatting of this note might be different from the original. Longstanding history of right shoulder pain refractory to conservative management.  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   Neck pain 06/30/2020   Other cervical disc degeneration, unspecified cervical region 02/12/2023   Other fecal abnormalities 02/12/2023   Overweight 10/21/2015   Description:     Pain of left humerus 07/24/2015   Description:     Pain, foot, right, chronic 10/06/2014   Description:     Persistent atrial fibrillation (HCC) 01/08/2023   Posterior vitreous detachment 06/30/2020   PTSD (post-traumatic stress disorder) 10/11/2022   Right shoulder pain 03/12/2014   Description:     S/P right knee arthroscopy 07/11/2016   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   Sinus bradycardia 12/29/2016   Sprain of ribs, initial encounter 02/12/2023   Strain of muscle and tendon of back wall of thorax, initial encounter 02/12/2023   Superficial injury of cornea 06/30/2020   Thoracic aortic aneurysm without rupture (HCC) 11/06/2017   Tobacco abuse, 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   flecainide (TAMBOCOR) 50 MG tablet Take 1 tablet (50 mg total) by mouth 2 (two) times daily.   ketorolac (ACULAR) 0.5 % ophthalmic solution Place 1 drop into the right eye in the morning, at noon, and at bedtime.   QUEtiapine (SEROQUEL) 25 MG tablet Take 25 mg by mouth at bedtime.   REFRESH TEARS 0.5 % SOLN Place 1 drop into both eyes 3 (three) times daily as needed (dry/irritated eyes.).   XARELTO 20 MG TABS tablet Take 20 mg by mouth in the morning.      EKGs/Labs/Other Studies Reviewed:    The following studies were reviewed today:  Cardiac Studies & Procedures       ECHOCARDIOGRAM  ECHOCARDIOGRAM COMPLETE 12/04/2022  Narrative ECHOCARDIOGRAM REPORT    Patient Name:   JUNE VACHA Date of Exam: 12/04/2022 Medical Rec #:  [id number]       Height:       72.0 in Accession #:    [id number]      Weight:       244.8 lb Date of Birth:  1946/04/06       BSA:          2.322 m Patient Age:    76 years        BP:  126/70 mmHg Patient Gender: M               HR:           71 bpm. Exam Location:  [facility]  Procedure: 2D Echo, Cardiac Doppler and Color Doppler  Indications:    Hypertensive heart disease without heart failure [I11.9 (ICD-10-CM)]  History:        Patient has no prior history of Echocardiogram examinations. Arrythmias:Atrial fibrillation with RVR; Risk Factors:Dyslipidemia.  Sonographer:    Louie Boston RDCS Referring Phys: [id number] Baldo Daub   Sonographer Comments: Global longitudinal strain was attempted. IMPRESSIONS   1. Left ventricular ejection fraction, by estimation, is 60 to 65%. The left ventricle has normal function. The left ventricle has no regional wall motion abnormalities. 2. Right ventricular systolic function is normal. The right ventricular size is normal. There is normal pulmonary artery systolic pressure. 3. Left atrial size was moderately dilated. 4.  The mitral valve is normal in structure. Mild mitral valve regurgitation. No evidence of mitral stenosis. 5. The aortic valve is normal in structure. Aortic valve regurgitation is not visualized. No aortic stenosis is present. 6. Aneurysm of the ascending aorta, measuring 43 mm. 7. The inferior vena cava is normal in size with greater than 50% respiratory variability, suggesting right atrial pressure of 3 mmHg.  FINDINGS Left Ventricle: Left ventricular ejection fraction, by estimation, is 60 to 65%. The left ventricle has normal function. The left ventricle has no regional wall motion abnormalities. The left ventricular internal cavity size was normal in size. There is no left ventricular hypertrophy. Left ventricular diastolic parameters are consistent with Grade III diastolic dysfunction (restrictive).  Right Ventricle: The right ventricular size is normal. No increase in right ventricular wall thickness. Right ventricular systolic function is normal. There is normal pulmonary artery systolic pressure. The tricuspid regurgitant velocity is 1.44 m/s, and with an assumed right atrial pressure of 8 mmHg, the estimated right ventricular systolic pressure is 16.3 mmHg.  Left Atrium: Left atrial size was moderately dilated.  Right Atrium: Right atrial size was normal in size.  Pericardium: There is no evidence of pericardial effusion.  Mitral Valve: The mitral valve is normal in structure. Mild mitral valve regurgitation. No evidence of mitral valve stenosis.  Tricuspid Valve: The tricuspid valve is normal in structure. Tricuspid valve regurgitation is not demonstrated. No evidence of tricuspid stenosis.  Aortic Valve: The aortic valve is normal in structure. Aortic valve regurgitation is not visualized. No aortic stenosis is present.  Pulmonic Valve: The pulmonic valve was normal in structure. Pulmonic valve regurgitation is not visualized. No evidence of pulmonic stenosis.  Aorta: The aortic  root is normal in size and structure. There is an aneurysm involving the ascending aorta measuring 43 mm.  Venous: The inferior vena cava is normal in size with greater than 50% respiratory variability, suggesting right atrial pressure of 3 mmHg.  IAS/Shunts: No atrial level shunt detected by color flow Doppler.   LEFT VENTRICLE PLAX 2D LVIDd:         5.30 cm      Diastology LVIDs:         4.50 cm      LV e' medial:    7.83 cm/s LV PW:         1.10 cm      LV E/e' medial:  10.6 LV IVS:        1.10 cm      LV e' lateral:   12.70 cm/s  LVOT diam:     2.20 cm      LV E/e' lateral: 6.6 LV SV:         62 LV SV Index:   27 LVOT Area:     3.80 cm  LV Volumes (MOD) LV vol d, MOD A2C: 129.0 ml LV vol d, MOD A4C: 109.0 ml LV vol s, MOD A2C: 62.3 ml LV vol s, MOD A4C: 47.1 ml LV SV MOD A2C:     66.7 ml LV SV MOD A4C:     109.0 ml LV SV MOD BP:      61.3 ml  RIGHT VENTRICLE            IVC RV Basal diam:  3.80 cm    IVC diam: 2.00 cm RV S prime:     9.36 cm/s TAPSE (M-mode): 2.3 cm  LEFT ATRIUM              Index        RIGHT ATRIUM           Index LA diam:        4.70 cm  2.02 cm/m   RA Area:     22.50 cm LA Vol (A2C):   100.0 ml 43.07 ml/m  RA Volume:   68.50 ml  29.50 ml/m LA Vol (A4C):   105.0 ml 45.23 ml/m LA Biplane Vol: 108.0 ml 46.52 ml/m AORTIC VALVE LVOT Vmax:   74.43 cm/s LVOT Vmean:  48.733 cm/s LVOT VTI:    0.163 m  AORTA Ao Root diam: 3.10 cm Ao Asc diam:  4.30 cm Ao Desc diam: 2.50 cm  MITRAL VALVE               TRICUSPID VALVE MV Area (PHT): 3.34 cm    TR Peak grad:   8.3 mmHg MV Decel Time: 227 msec    TR Vmax:        144.00 cm/s MV E velocity: 83.30 cm/s SHUNTS Systemic VTI:  0.16 m Systemic Diam: 2.20 cm  Belva Crome MD Electronically signed by Belva Crome MD Signature Date/Time: 12/04/2022/11:52:17 AM    Final             EKG Interpretation Date/Time:  Wednesday February 14 2023 07:49:26 EDT Ventricular Rate:  71 PR Interval:    QRS  Duration:  114 QT Interval:  408 QTC Calculation: 443 R Axis:   28  Text Interpretation: Atrial fibrillation Incomplete right bundle branch block When compared with ECG of 08-Jan-2023 07:51, Incomplete right bundle branch block has replaced Right bundle branch block Minimal criteria for Anterior infarct are now Present Confirmed by Norman Herrlich ([id number]) on 02/14/2023 7:56:16 AM   Recent Labs: 01/08/2023: BUN 12; Creatinine, Ser 0.90; Hemoglobin 15.3; Potassium 4.3; Sodium 145  Recent Lipid Panel No results found for: "CHOL", "TRIG", "HDL", "CHOLHDL", "VLDL", "LDLCALC", "LDLDIRECT"  Physical Exam:    VS:  BP 120/70   Pulse 71   Ht 6' (1.829 m)   Wt 234 lb 6.4 oz (106.3 kg)   SpO2 96%   BMI 31.79 kg/m     Wt Readings from Last 3 Encounters:  02/14/23 234 lb 6.4 oz (106.3 kg)  01/08/23 240 lb (108.9 kg)  12/25/22 242 lb 9.6 oz (110 kg)     GEN:  Well nourished, well developed in no acute distress HEENT: Normal NECK: No JVD; No carotid bruits LYMPHATICS: No lymphadenopathy CARDIAC: Irregular rhythm variable first heart sound controlled range  RESPIRATORY:  Clear to auscultation  without rales, wheezing or rhonchi  ABDOMEN: Soft, non-tender, non-distended MUSCULOSKELETAL:  No edema; No deformity  SKIN: Warm and dry NEUROLOGIC:  Alert and oriented x 3 PSYCHIATRIC:  Normal affect    Signed, Norman Herrlich, MD  02/14/2023 8:15 AM    [facility]

## 2023-02-14 ENCOUNTER — Encounter: Payer: Self-pay | Admitting: Cardiology

## 2023-02-14 ENCOUNTER — Ambulatory Visit: Payer: No Typology Code available for payment source | Attending: Cardiology | Admitting: Cardiology

## 2023-02-14 VITALS — BP 120/70 | HR 71 | Ht 72.0 in | Wt 234.4 lb

## 2023-02-14 DIAGNOSIS — I7121 Aneurysm of the ascending aorta, without rupture: Secondary | ICD-10-CM | POA: Diagnosis not present

## 2023-02-14 DIAGNOSIS — I4819 Other persistent atrial fibrillation: Secondary | ICD-10-CM | POA: Diagnosis not present

## 2023-02-14 DIAGNOSIS — D6869 Other thrombophilia: Secondary | ICD-10-CM | POA: Diagnosis not present

## 2023-02-14 DIAGNOSIS — I10 Essential (primary) hypertension: Secondary | ICD-10-CM

## 2023-02-14 NOTE — Patient Instructions (Addendum)
Medication Instructions:  Your physician recommends that you continue on your current medications as directed. Please refer to the Current Medication list given to you today.  *If you need a refill on your cardiac medications before your next appointment, please call your pharmacy*   Lab Work: None If you have labs (blood work) drawn today and your tests are completely normal, you will receive your results only by: MyChart Message (if you have MyChart) OR A paper copy in the mail If you have any lab test that is abnormal or we need to change your treatment, we will call you to review the results.   Testing/Procedures: Non-Cardiac CT scanning, (CAT scanning), is a noninvasive, special x-ray that produces cross-sectional images of the body using x-rays and a computer. CT scans help physicians diagnose and treat medical conditions. For some CT exams, a contrast material is used to enhance visibility in the area of the body being studied. CT scans provide greater clarity and reveal more details than regular x-ray exams.    Follow-Up: At [facility], you and your health needs are our priority.  As part of our continuing mission to provide you with exceptional heart care, we have created designated Provider Care Teams.  These Care Teams include your primary Cardiologist (physician) and Advanced Practice Providers (APPs -  Physician Assistants and Nurse Practitioners) who all work together to provide you with the care you need, when you need it.  We recommend signing up for the patient portal called "MyChart".  Sign up information is provided on this After Visit Summary.  MyChart is used to connect with patients for Virtual Visits (Telemedicine).  Patients are able to view lab/test results, encounter notes, upcoming appointments, etc.  Non-urgent messages can be sent to your provider as well.   To learn more about what you can do with MyChart, go to [email or web address].    Your next  appointment:   1 year(s)  Provider:   Norman Herrlich, MD    Other Instructions Get a Samsung watch

## 2023-02-22 DIAGNOSIS — H3581 Retinal edema: Secondary | ICD-10-CM | POA: Diagnosis not present

## 2023-04-05 ENCOUNTER — Other Ambulatory Visit: Payer: Self-pay

## 2023-04-05 DIAGNOSIS — I4819 Other persistent atrial fibrillation: Secondary | ICD-10-CM

## 2023-04-12 ENCOUNTER — Telehealth: Payer: Self-pay | Admitting: Cardiology

## 2023-04-12 ENCOUNTER — Telehealth (HOSPITAL_COMMUNITY): Payer: Self-pay | Admitting: *Deleted

## 2023-04-12 NOTE — Telephone Encounter (Signed)
Called patient and he stated that there was a possibility that he was going to have open heart surgery.He stated that he would like to hold off on having an ablation until his doctors decide if he will need open heart surgery. If he does not need open heart surgery then he will decide if he wants to have an ablation at that time.  He was also asking if he needed to have a CT of his chest to evaluate his aortic aneurysm. Looking back at Dr. Hulen Shouts last office visit note he noted that he would like him to have another CT of his chest in January 2025. I informed the patient of this. Patient had no further questions at this time.

## 2023-04-12 NOTE — Telephone Encounter (Signed)
Patient asking about an upcoming CT appointment. Informed the patient the CT scheduled was prep for his ablation. He states that he does not want to undergo the ablation procedure.  I told him I will forward this message to Dr. Gershon Crane team.  [name] RN Navigator Cardiac Imaging [facility] [phone] Office [phone] Cell

## 2023-04-12 NOTE — Telephone Encounter (Signed)
Patient stated he did not want to have the surgery on 10/10 and wants to cancel that visit.  Patient stated if the CT CARDIAC MORPH/PULM VEIN W/C test is related to his aneurysm the he will still do the test on 10/3.  Patient wants call back to clarify this.

## 2023-04-13 ENCOUNTER — Telehealth: Payer: Self-pay | Admitting: *Deleted

## 2023-04-13 NOTE — Telephone Encounter (Signed)
Received a message from radiology that pt wants to cancel ablation at this time. Currently scheduled for 10/10 Followed up with pt who reports he may need heart surgery for aortic aneurysm, and he would like to address that prior to afib ablation. Will cancel ablation procedure.

## 2023-04-23 DIAGNOSIS — L237 Allergic contact dermatitis due to plants, except food: Secondary | ICD-10-CM | POA: Diagnosis not present

## 2023-04-25 DIAGNOSIS — H3581 Retinal edema: Secondary | ICD-10-CM | POA: Diagnosis not present

## 2023-04-25 DIAGNOSIS — H35371 Puckering of macula, right eye: Secondary | ICD-10-CM | POA: Diagnosis not present

## 2023-04-26 ENCOUNTER — Ambulatory Visit (HOSPITAL_COMMUNITY): Payer: Medicare Other

## 2023-04-27 NOTE — Telephone Encounter (Signed)
Followed up w/ pt about aortic aneurysm appt. He explains that Dr. Dulce Sellar is following this. Believes he will be scheduled for a follow up CT but does not have a date yet.  Pt informed that I would forward this to Dr. Dulce Sellar to refer back to Dr. Elberta Fortis if/when pt would like to proceed with scheduling an ablation . Pt is agreeable to this plan and appreciates the follow up call.

## 2023-05-03 ENCOUNTER — Encounter (HOSPITAL_COMMUNITY): Payer: Self-pay

## 2023-05-03 ENCOUNTER — Ambulatory Visit (HOSPITAL_COMMUNITY): Admit: 2023-05-03 | Payer: Medicare Other | Admitting: Cardiology

## 2023-05-03 SURGERY — ATRIAL FIBRILLATION ABLATION
Anesthesia: General

## 2023-06-25 DIAGNOSIS — K581 Irritable bowel syndrome with constipation: Secondary | ICD-10-CM | POA: Diagnosis not present

## 2023-06-28 DIAGNOSIS — H3581 Retinal edema: Secondary | ICD-10-CM | POA: Diagnosis not present

## 2023-07-06 ENCOUNTER — Telehealth: Payer: Self-pay

## 2023-07-06 ENCOUNTER — Other Ambulatory Visit: Payer: Self-pay

## 2023-07-06 ENCOUNTER — Telehealth: Payer: Self-pay | Admitting: Cardiology

## 2023-07-06 DIAGNOSIS — K639 Disease of intestine, unspecified: Secondary | ICD-10-CM | POA: Diagnosis not present

## 2023-07-06 DIAGNOSIS — R634 Abnormal weight loss: Secondary | ICD-10-CM | POA: Diagnosis not present

## 2023-07-06 DIAGNOSIS — I4891 Unspecified atrial fibrillation: Secondary | ICD-10-CM | POA: Diagnosis not present

## 2023-07-06 NOTE — Telephone Encounter (Signed)
   Pre-operative Risk Assessment  Last visit: 02/14/2023 Next visit: none  Patient Name: Douglas Dudley  DOB: 08/24/1945 MRN: [id number]      Request for Surgical Clearance    Procedure:   colonoscopy  Date of Surgery:  Clearance 08/09/23                                 Surgeon:  Dr. Lavonne Chick Group or Practice Name:  [facility] Phone number:  [phone] Fax number:  [phone]   Type of Clearance Requested:   - Pharmacy:  Hold Apixaban (Eliquis) okay to hold 2 days prior?   Type of Anesthesia:   propofol   Additional requests/questions:    Signed, Royann Shivers   07/06/2023, 10:25 AM

## 2023-07-06 NOTE — Telephone Encounter (Signed)
Patient with diagnosis of afib on Xarelto for anticoagulation.    Procedure: colonoscopy  Date of procedure: 08/09/23   CHA2DS2-VASc Score = 4   This indicates a 4.8% annual risk of stroke. The patient's score is based upon: CHF History: 0 HTN History: 1 Diabetes History: 0 Stroke History: 0 Vascular Disease History: 1 Age Score: 2 Gender Score: 0      CrCl 108 ml/min  Per office protocol, patient can hold Xarelto for 2 days prior to procedure.    **This guidance is not considered finalized until pre-operative APP has relayed final recommendations.**

## 2023-07-06 NOTE — Telephone Encounter (Signed)
Called and spoke to patient regarding clearance scheduled patient on 1/9 for telephone appt patient voiced understanding consent and med rec done.... SN

## 2023-07-06 NOTE — Telephone Encounter (Signed)
   Name: Douglas Dudley  DOB: 04-30-1946  MRN: [id number]  Primary Cardiologist:    Preoperative team, please contact this patient and set up a phone call appointment for further preoperative risk assessment. Please obtain consent and complete medication review. Thank you for your help.Not seen since 02/14/2023  I confirm that guidance regarding antiplatelet and oral anticoagulation therapy has been completed and, if necessary, noted below.  Per office protocol, patient can hold Xarelto for 2 days prior to procedure.    I also confirmed the patient resides in the state of [place]. As per [facility] Medical Board telemedicine laws, the patient must reside in the state in which the provider is licensed.   Joni Reining, NP 07/06/2023, 12:50 PM [facility]

## 2023-07-06 NOTE — Telephone Encounter (Signed)
Called and spoke to patient scheduled telephone visit for clearance on 1/9 patient voiced understanding consent and med rec done... SN     Patient Consent for Virtual Visit        [name] has provided verbal consent on 07/06/2023 for a virtual visit (video or telephone).   CONSENT FOR VIRTUAL VISIT FOR:  Douglas Dudley  By participating in this virtual visit I agree to the following:  I hereby voluntarily request, consent and authorize [facility] and its employed or contracted physicians, physician assistants, nurse practitioners or other licensed health care professionals (the Practitioner), to provide me with telemedicine health care services (the "Services") as deemed necessary by the treating Practitioner. I acknowledge and consent to receive the Services by the Practitioner via telemedicine. I understand that the telemedicine visit will involve communicating with the Practitioner through live audiovisual communication technology and the disclosure of certain medical information by electronic transmission. I acknowledge that I have been given the opportunity to request an in-person assessment or other available alternative prior to the telemedicine visit and am voluntarily participating in the telemedicine visit.  I understand that I have the right to withhold or withdraw my consent to the use of telemedicine in the course of my care at any time, without affecting my right to future care or treatment, and that the Practitioner or I may terminate the telemedicine visit at any time. I understand that I have the right to inspect all information obtained and/or recorded in the course of the telemedicine visit and may receive copies of available information for a reasonable fee.  I understand that some of the potential risks of receiving the Services via telemedicine include:  Delay or interruption in medical evaluation due to technological equipment failure or  disruption; Information transmitted may not be sufficient (e.g. poor resolution of images) to allow for appropriate medical decision making by the Practitioner; and/or  In rare instances, security protocols could fail, causing a breach of personal health information.  Furthermore, I acknowledge that it is my responsibility to provide information about my medical history, conditions and care that is complete and accurate to the best of my ability. I acknowledge that Practitioner's advice, recommendations, and/or decision may be based on factors not within their control, such as incomplete or inaccurate data provided by me or distortions of diagnostic images or specimens that may result from electronic transmissions. I understand that the practice of medicine is not an exact science and that Practitioner makes no warranties or guarantees regarding treatment outcomes. I acknowledge that a copy of this consent can be made available to me via my patient portal [facility] MyChart), or I can request a printed copy by calling the office of [facility].    I understand that my insurance will be billed for this visit.   I have read or had this consent read to me. I understand the contents of this consent, which adequately explains the benefits and risks of the Services being provided via telemedicine.  I have been provided ample opportunity to ask questions regarding this consent and the Services and have had my questions answered to my satisfaction. I give my informed consent for the services to be provided through the use of telemedicine in my medical care

## 2023-07-06 NOTE — Telephone Encounter (Signed)
Pharmacy please advise on holding Xarelto prior to colonoscopy scheduled for 08/09/2023. Thank you.

## 2023-07-09 DIAGNOSIS — C44729 Squamous cell carcinoma of skin of left lower limb, including hip: Secondary | ICD-10-CM | POA: Diagnosis not present

## 2023-07-10 DIAGNOSIS — L859 Epidermal thickening, unspecified: Secondary | ICD-10-CM | POA: Diagnosis not present

## 2023-08-02 ENCOUNTER — Ambulatory Visit: Payer: Medicare Other | Attending: Cardiology | Admitting: Student

## 2023-08-02 DIAGNOSIS — Z01810 Encounter for preprocedural cardiovascular examination: Secondary | ICD-10-CM

## 2023-08-02 NOTE — Progress Notes (Signed)
 Virtual Visit via Telephone Note   Because of Douglas Dudley's co-morbid illnesses, he is at least at moderate risk for complications without adequate follow up.  This format is felt to be most appropriate for this patient at this time.  The patient did not have access to video technology/had technical difficulties with video requiring transitioning to audio format only (telephone).  All issues noted in this document were discussed and addressed.  No physical exam could be performed with this format.  Please refer to the patient's chart for his consent to telehealth for [facility].  Evaluation Performed:  Preoperative cardiovascular risk assessment _____________   Date:  08/02/2023   Patient ID:  Douglas Dudley, Douglas Dudley 1946-07-21, MRN [id number] Patient Location:  Home Provider location:   Office  Primary Care Provider:  Keren Vicenta FORBES, MD Primary Cardiologist:  Redell Leiter, MD  Chief Complaint / Patient Profile   78 y.o. y/o male with a h/o coronary artery calcification, persistent A-fib on anticoagulation, thoracic aortic aneurysm, hypertension, hyperlipidemia, GERD,  who is pending colonoscopy by Dr. Donnald and presents today for telephonic preoperative cardiovascular risk assessment.  History of Present Illness    Douglas Dudley is a 78 y.o. male who presents via audio/video conferencing for a telehealth visit today.  Pt was last seen in cardiology clinic on 02/14/2023 by Dr. Leiter.  At that time Douglas Dudley was stable from a cardiac standpoint.  The patient is now pending procedure as outlined above. Since his last visit, he is doing well. Patient denies shortness of breath, dyspnea on exertion, lower extremity edema, orthopnea or PND. No chest pain, pressure, or tightness. No palpitations.  He remains active doing moderate to heavy projects around his him. He recently knocked down a carport and is going to rebuild it. He also performs curator work.   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   Edema, unspecified 02/12/2023   Esophageal reflux 06/30/2020   Essential hypertension 06/30/2020   Exposure to potentially hazardous substance 02/12/2023   Flatulence, eructation and gas pain 06/30/2020   Hammer toe of right foot 10/06/2014   Description:     Hip pain, right 01/19/2015   Description:   Description:   Description:     History of colonic polyps 06/30/2020   Hyperlipidemia 10/11/2022   Hypertensive heart disease 06/30/2020   Hypertrophic and atrophic condition of skin 06/30/2020   Imaging of gastrointestinal tract abnormal 06/30/2020   Incomplete tear of right rotator  cuff 01/01/2018   Formatting of this note might be different from the original. Added automatically from request for surgery [id number] Last Assessment & Plan: Formatting of this note might be  different from the original. Longstanding history of right shoulder pain refractory to conservative management.  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   Neck pain 06/30/2020   Other cervical disc degeneration, unspecified cervical region 02/12/2023   Other fecal abnormalities 02/12/2023   Overweight 10/21/2015   Description:     Pain of left humerus 07/24/2015   Description:     Pain, foot, right, chronic 10/06/2014   Description:     Persistent atrial fibrillation (HCC) 01/08/2023   Posterior vitreous detachment 06/30/2020   PTSD (post-traumatic stress disorder) 10/11/2022   Right shoulder pain 03/12/2014   Description:     S/P right knee arthroscopy 07/11/2016   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   Sinus bradycardia 12/29/2016   Sprain of ribs, initial encounter 02/12/2023   Strain of muscle and tendon of back wall of thorax, initial encounter 02/12/2023   Superficial injury of cornea 06/30/2020   Thoracic aortic aneurysm without rupture (HCC) 11/06/2017   Tobacco abuse, 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 Dr. Larnell   APPENDECTOMY     CARDIOVERSION  08/15/2016   Dr. Monetta   CARDIOVERSION N/A 01/08/2023   Procedure: CARDIOVERSION;   Surgeon: Shlomo Wilbert SAUNDERS, MD;  Location: [facility] INVASIVE CV LAB;  Service: Cardiovascular;  Laterality: N/A;   CARPAL TUNNEL RELEASE Bilateral 1981   CATARACT EXTRACTION Left 07/2020   CATARACT EXTRACTION Right 11/24/2016   FRACTURE SURGERY Left    Arm   HIP SURGERY Left 02/08/2012   Replacement by Alm Going, MD @ [facility]   KNEE SURGERY Right    NASAL SINUS SURGERY     PARS PLANA VITRECTOMY Right 07/10/2016   TONSILLECTOMY      Allergies  Allergies  Allergen Reactions   Amiodarone Nausea Only and Other (See Comments)    Dizziness    Home Medications    Prior to Admission medications   Medication Sig Start Date End Date Taking? Authorizing Provider  atorvastatin (LIPITOR) 20 MG tablet Take 20 mg by mouth daily. 07/06/23   [provider]  Cholecalciferol (VITAMIN D3) 50 MCG (2000 UT) TABS Take 2,000 Units by mouth in the morning.    [provider]  flecainide  (TAMBOCOR ) 50 MG tablet Take 1 tablet (50 mg total) by mouth 2 (two) times daily. 01/24/23   Camnitz, Soyla Lunger, MD  ketorolac  (ACULAR ) 0.5 % ophthalmic solution Place 1 drop into the right eye in the morning, at noon, and at bedtime. 08/16/22 08/17/23  [provider]  QUEtiapine (SEROQUEL) 25 MG tablet Take 25 mg by mouth at  bedtime.    [provider]  REFRESH TEARS 0.5 % SOLN Place 1 drop into both eyes 3 (three) times daily as needed (dry/irritated eyes.). 06/29/22   [provider]  XARELTO 20 MG TABS tablet Take 20 mg by mouth in the morning. 05/11/20   [provider]    Physical Exam    Vital Signs:  Douglas Dudley does not have vital signs available for review today.  Given telephonic nature of communication, physical exam is limited. AAOx3. NAD. Normal affect.  Speech and respirations are unlabored.   Assessment & Plan    Primary Cardiologist: Redell Leiter, MD  Preoperative cardiovascular risk assessment.  Colonoscopy by Dr. Donnald on  08/09/2023.  Chart reviewed as part of pre-operative protocol coverage. According to the RCRI, patient has a 0.4% risk of MACE. Patient reports activity equivalent to >4.0 METS (performs moderate to heavy projects around his home such as taking down and rebuilding a car port).   Given past medical history and time since last visit, based on ACC/AHA guidelines, Douglas Dudley would be at acceptable risk for the planned procedure without further cardiovascular testing.   Patient was advised that if he develops new symptoms prior to surgery to contact our office to arrange a follow-up appointment.  he verbalized understanding.  Per Pharm D, patient may hold Xarelto for 2 days prior to procedure.    I will route this recommendation to the requesting party via Epic fax function.  Please call with questions.  Time:   Today, I have spent 8 minutes with the patient with telehealth technology discussing medical history, symptoms, and management plan.     Barnie Hila, NP  08/02/2023, 9:08 AM

## 2023-08-09 DIAGNOSIS — Z09 Encounter for follow-up examination after completed treatment for conditions other than malignant neoplasm: Secondary | ICD-10-CM | POA: Diagnosis not present

## 2023-08-09 DIAGNOSIS — Z860101 Personal history of adenomatous and serrated colon polyps: Secondary | ICD-10-CM | POA: Diagnosis not present

## 2023-08-09 DIAGNOSIS — K635 Polyp of colon: Secondary | ICD-10-CM | POA: Diagnosis not present

## 2023-08-14 DIAGNOSIS — I480 Paroxysmal atrial fibrillation: Secondary | ICD-10-CM | POA: Diagnosis not present

## 2023-08-14 DIAGNOSIS — K641 Second degree hemorrhoids: Secondary | ICD-10-CM | POA: Diagnosis not present

## 2023-08-14 DIAGNOSIS — K645 Perianal venous thrombosis: Secondary | ICD-10-CM | POA: Diagnosis not present

## 2023-08-14 DIAGNOSIS — K635 Polyp of colon: Secondary | ICD-10-CM | POA: Diagnosis not present

## 2023-08-23 DIAGNOSIS — H35371 Puckering of macula, right eye: Secondary | ICD-10-CM | POA: Diagnosis not present

## 2023-08-23 DIAGNOSIS — H3581 Retinal edema: Secondary | ICD-10-CM | POA: Diagnosis not present

## 2023-09-05 DIAGNOSIS — I87332 Chronic venous hypertension (idiopathic) with ulcer and inflammation of left lower extremity: Secondary | ICD-10-CM | POA: Diagnosis not present

## 2023-09-05 DIAGNOSIS — I83023 Varicose veins of left lower extremity with ulcer of ankle: Secondary | ICD-10-CM | POA: Diagnosis not present

## 2023-09-05 DIAGNOSIS — I872 Venous insufficiency (chronic) (peripheral): Secondary | ICD-10-CM | POA: Diagnosis not present

## 2023-09-05 DIAGNOSIS — I10 Essential (primary) hypertension: Secondary | ICD-10-CM | POA: Diagnosis not present

## 2023-10-24 DIAGNOSIS — H43812 Vitreous degeneration, left eye: Secondary | ICD-10-CM | POA: Diagnosis not present

## 2023-10-24 DIAGNOSIS — H35371 Puckering of macula, right eye: Secondary | ICD-10-CM | POA: Diagnosis not present

## 2023-10-24 DIAGNOSIS — H3581 Retinal edema: Secondary | ICD-10-CM | POA: Diagnosis not present

## 2023-11-08 ENCOUNTER — Encounter: Payer: Self-pay | Admitting: Cardiology

## 2023-11-12 ENCOUNTER — Other Ambulatory Visit: Payer: Self-pay

## 2023-11-12 DIAGNOSIS — I7121 Aneurysm of the ascending aorta, without rupture: Secondary | ICD-10-CM

## 2023-12-13 ENCOUNTER — Ambulatory Visit

## 2023-12-13 DIAGNOSIS — I7121 Aneurysm of the ascending aorta, without rupture: Secondary | ICD-10-CM | POA: Diagnosis not present

## 2023-12-19 ENCOUNTER — Ambulatory Visit: Payer: Self-pay | Admitting: Cardiology

## 2023-12-26 DIAGNOSIS — H3581 Retinal edema: Secondary | ICD-10-CM | POA: Diagnosis not present

## 2023-12-28 ENCOUNTER — Telehealth: Payer: Self-pay

## 2023-12-28 NOTE — Telephone Encounter (Signed)
 Left message on My Chart with [place] results per Dr. Vanetta Shawl note. Routed to PCP.

## 2024-01-04 ENCOUNTER — Telehealth: Payer: Self-pay

## 2024-01-04 NOTE — Telephone Encounter (Signed)
 Pt viewed [place] results on My Chart per Dr. Vanetta Shawl note. Routed to PCP.

## 2024-01-05 NOTE — Progress Notes (Unsigned)
  Electrophysiology Office Note:   Date:  01/07/2024  ID:  Douglas, Dudley [birth year], MRN [id number]  Primary Cardiologist: Zoe Hinds, MD Primary Heart Failure: None Electrophysiologist: None      History of Present Illness:   Douglas Dudley is a 78 y.o. male with h/o atrial fibrillation, hypertension, hyperlipidemia, thoracic aortic aneurysm seen today for routine electrophysiology followup.   Since last being seen in our clinic the patient reports doing well.  He is unaware of his atrial fibrillation.  He continues to be quite active throughout the day.  He does not have shortness of breath or fatigue.  He is able to do all of his daily activities without restriction.  he denies chest pain, palpitations, dyspnea, PND, orthopnea, nausea, vomiting, dizziness, syncope, edema, weight gain, or early satiety.   Review of systems complete and found to be negative unless listed in HPI.   EP Information / Studies Reviewed:    EKG is ordered today. Personal review as below.  EKG Interpretation Date/Time:  Monday January 07 2024 09:38:57 EDT Ventricular Rate:  52 PR Interval:    QRS Duration:  110 QT Interval:  428 QTC Calculation: 398 R Axis:   55  Text Interpretation: Atrial fibrillation with slow ventricular response Incomplete right bundle branch block Abnormal ECG When compared with ECG of 14-Feb-2023 07:49, No significant change since last tracing Confirmed by Douglas Dudley ([id number]) on 01/07/2024 9:40:49 AM   Risk Assessment/Calculations:    CHA2DS2-VASc Score = 4   This indicates a 4.8% annual risk of stroke. The patient's score is based upon: CHF History: 0 HTN History: 1 Diabetes History: 0 Stroke History: 0 Vascular Disease History: 1 Age Score: 2 Gender Score: 0            Physical Exam:   VS:  BP (!) 138/92   Pulse (!) 52   Ht 6' (1.829 m)   Wt 224 lb 6.4 oz (101.8 kg)   SpO2 97%   BMI 30.43 kg/m    Wt Readings from Last 3 Encounters:  01/07/24 224 lb 6.4  oz (101.8 kg)  02/14/23 234 lb 6.4 oz (106.3 kg)  01/08/23 240 lb (108.9 kg)     GEN: Well nourished, well developed in no acute distress NECK: No JVD; No carotid bruits CARDIAC: Irregularly irregular rate and rhythm, no murmurs, rubs, gallops RESPIRATORY:  Clear to auscultation without rales, wheezing or rhonchi  ABDOMEN: Soft, non-tender, non-distended EXTREMITIES:  No edema; No deformity   ASSESSMENT AND PLAN:    1.  Persistent atrial fibrillation: Had side effects on Xarelto.  Post cardioversion 01/08/2023.  He is back in atrial fibrillation.  He has no acute complaints at this time.  He has not had weakness, fatigue, shortness of breath.  Due to that, we Douglas Dudley plan for a rate control strategy.  No further plans for rhythm control at this time.  Douglas Dudley have him follow-up with general cardiology and see him back on an as needed basis.  2.  Secondary hypercoagulable state: On Xarelto for atrial fibrillation  3.  Hypertension: Mildly elevated.  Plan per primary cardiology and primary care  6.  Ascending aortic aneurysm: Followed by primary cardiology  Follow up with Dr. Lawana Pray as needed   Signed, Douglas Barreira Cortland Ding, MD

## 2024-01-07 ENCOUNTER — Encounter: Payer: Self-pay | Admitting: Cardiology

## 2024-01-07 ENCOUNTER — Ambulatory Visit: Payer: Medicare Other | Attending: Cardiology | Admitting: Cardiology

## 2024-01-07 VITALS — BP 138/92 | HR 52 | Ht 72.0 in | Wt 224.4 lb

## 2024-01-07 DIAGNOSIS — I4819 Other persistent atrial fibrillation: Secondary | ICD-10-CM

## 2024-01-07 DIAGNOSIS — I10 Essential (primary) hypertension: Secondary | ICD-10-CM | POA: Diagnosis not present

## 2024-01-07 DIAGNOSIS — D6869 Other thrombophilia: Secondary | ICD-10-CM | POA: Diagnosis not present

## 2024-01-07 NOTE — Patient Instructions (Signed)
 Medication Instructions:  Your physician recommends that you continue on your current medications as directed. Please refer to the Current Medication list given to you today.  *If you need a refill on your cardiac medications before your next appointment, please call your pharmacy*  Lab Work: None ordered   Testing/Procedures: None ordered  Follow-Up: At [facility], you and your health needs are our priority.  As part of our continuing mission to provide you with exceptional heart care, our providers are all part of one team.  This team includes your primary Cardiologist (physician) and Advanced Practice Providers or APPs (Physician Assistants and Nurse Practitioners) who all work together to provide you with the care you need, when you need it.  Your next appointment:   as needed  Provider:   Agatha Horsfall, MD     Thank you for choosing [facility]!!   Reece Cane, RN [phone]

## 2024-01-11 ENCOUNTER — Encounter: Payer: Self-pay | Admitting: Cardiology

## 2024-02-06 DIAGNOSIS — M545 Low back pain, unspecified: Secondary | ICD-10-CM | POA: Insufficient documentation

## 2024-02-11 ENCOUNTER — Ambulatory Visit: Admitting: Cardiology

## 2024-02-21 DIAGNOSIS — H35371 Puckering of macula, right eye: Secondary | ICD-10-CM | POA: Diagnosis not present

## 2024-02-21 DIAGNOSIS — H3581 Retinal edema: Secondary | ICD-10-CM | POA: Diagnosis not present

## 2024-03-10 DIAGNOSIS — S32000A Wedge compression fracture of unspecified lumbar vertebra, initial encounter for closed fracture: Secondary | ICD-10-CM | POA: Insufficient documentation

## 2024-03-10 DIAGNOSIS — D6869 Other thrombophilia: Secondary | ICD-10-CM | POA: Insufficient documentation

## 2024-03-10 DIAGNOSIS — I482 Chronic atrial fibrillation, unspecified: Secondary | ICD-10-CM | POA: Insufficient documentation

## 2024-03-10 DIAGNOSIS — I700 Atherosclerosis of aorta: Secondary | ICD-10-CM | POA: Insufficient documentation

## 2024-03-28 DIAGNOSIS — M51379 Other intervertebral disc degeneration, lumbosacral region without mention of lumbar back pain or lower extremity pain: Secondary | ICD-10-CM | POA: Diagnosis not present

## 2024-03-28 DIAGNOSIS — M5416 Radiculopathy, lumbar region: Secondary | ICD-10-CM | POA: Diagnosis not present

## 2024-03-28 DIAGNOSIS — S32040A Wedge compression fracture of fourth lumbar vertebra, initial encounter for closed fracture: Secondary | ICD-10-CM | POA: Diagnosis not present

## 2024-04-10 ENCOUNTER — Telehealth: Payer: Self-pay | Admitting: Cardiology

## 2024-04-10 ENCOUNTER — Telehealth: Payer: Self-pay

## 2024-04-10 NOTE — Telephone Encounter (Signed)
   Pre-operative Risk Assessment    Patient Name: Douglas Dudley  DOB: 04-28-1946 MRN: [id number]   Date of last office visit: 01/07/24  Date of next office visit: 04/17/24    Request for Surgical Clearance    Procedure:  Kyphoplasty  Date of Surgery:  Clearance 04/15/24                                Surgeon:  Dr. Belvie Daring  Surgeon's Group or Practice Name:  [facility]  Phone number:  [phone]  Fax number:  [phone]    Type of Clearance Requested:   - Medical  - Pharmacy:  Hold Rivaroxaban (Xarelto) 3 days prior, restart day after    Type of Anesthesia:  Moderate Sedation in conjunction with local anesthetic    Additional requests/questions:    [name]   04/10/2024, 10:52 AM

## 2024-04-10 NOTE — Telephone Encounter (Signed)
 Patient came to the office and was asking if Dr. Monetta could clear him to have back surgery today because the surgery was on 04/15/24. He was told that Dr. Monetta was not in the office and a message was sent to his nurse to come up to the front and speak with Mr. Douglas Dudley. Patient left before Dr. Leandrew nurse could come up to the front and speak with the patient about his cardiac clearance. The patient was called by Dr. Leandrew nurse and he kept assuming that I was calling from Dr. Bernardo office at the [facility]. Many times during our conversation I had to reorient him that I was calling from [facility] in [place]. After speaking for a few minutes he stated that he was getting another call from [facility] and hung up the phone.

## 2024-04-10 NOTE — Telephone Encounter (Signed)
 SABRA

## 2024-04-10 NOTE — Telephone Encounter (Signed)
 Patient with diagnosis of afib on Xarelto for anticoagulation.    Procedure:  Kyphoplasty  Date of procedure: 04/15/24   CHA2DS2-VASc Score = 4   This indicates a 4.8% annual risk of stroke. The patient's score is based upon: CHF History: 0 HTN History: 1 Diabetes History: 0 Stroke History: 0 Vascular Disease History: 1 Age Score: 2 Gender Score: 0      CrCl 113 Platelet count 139  Patient has not had an Afib/aflutter ablation or Watchman within the last 3 months or DCCV within the last 30 days   Per office protocol, patient can hold Xarelto for 3 days prior to procedure.    **This guidance is not considered finalized until pre-operative APP has relayed final recommendations.**

## 2024-04-10 NOTE — Telephone Encounter (Signed)
   Name: Douglas Dudley  DOB: 1946/07/17  MRN: [id number]  Primary Cardiologist: Redell Leiter, MD  Chart reviewed as part of pre-operative protocol coverage. Because of Douglas Dudley's past medical history and time since last visit, he will require a follow-up in-office visit in order to better assess preoperative cardiovascular risk.  Pre-op covering staff: - Please schedule appointment and call patient to inform them. If patient already had an upcoming appointment within acceptable timeframe, please add pre-op clearance to the appointment notes so provider is aware. - Please contact requesting surgeon's office via preferred method (i.e, phone, fax) to inform them of need for appointment prior to surgery.  Per office protocol, patient can hold Xarelto for 3 days prior to procedure.  He can resume when medically safe to do so.  Orren LOISE Fabry, PA-C  04/10/2024, 1:44 PM

## 2024-04-10 NOTE — Telephone Encounter (Signed)
 Appointment notes have been updated

## 2024-04-14 NOTE — Progress Notes (Unsigned)
 Cardiology Office Note:    Date:  04/14/2024   ID:  Douglas Dudley, Douglas Dudley 09/28/1945, MRN [id number]  PCP:  Keren Vicenta FORBES, MD  Cardiologist:  Redell Leiter, MD    Referring MD: Keren Vicenta FORBES, MD    ASSESSMENT:    1. Preop cardiovascular exam   2. Permanent atrial fibrillation (HCC)   3. Chronic anticoagulation   4. Aneurysm of ascending aorta without rupture (HCC)   5. Hypertensive heart disease without heart failure    PLAN:    In order of problems listed above:  This may be the easiest preoperative evaluation I have ever performed he has already had the surgery and he is back on his anticoagulant.  What happened is that he came to the office with a request to bypass the preoperative pool and fortuately the physicians at the [facility] gave him instructions how to withhold his antibiotic and to restart very appropriately He is in rate controlled atrial fibrillation without suppressant therapy I told him the goal of blood pressure is more in the range of what less than 120/80 he feels very comfortable he staying there and concerned about the device he is using he is going to talk to his [facility] physicians about getting a validated blood pressure device if he needed treatment I would use a beta-blocker to put him a little dose ARB He will continue his current anticoagulant Aneurysm is followed through the [facility] he should avoid alone antibiotic listed as an allergy/intolerance   Next appointment: 1 year follow-up with me   Medication Adjustments/Labs and Tests Ordered: Current medicines are reviewed at length with the patient today.  Concerns regarding medicines are outlined above.  No orders of the defined types were placed in this encounter.  No orders of the defined types were placed in this encounter.    History of Present Illness:    Douglas Dudley is a 78 y.o. male with a hx of chronic atrial fibrillation hypertension aneurysm of the ascending aorta  previously 4.6 cm last seen 02/14/2023.  He is seen today in anticipation of kyphoplasty procedure scheduled 04/15/2024 he was seen by my EP colleague Dr. Inocencio and had cardioversion performed in June 24 failing to maintain sinus rhythm and the decision was made to continue with rate control and anticoagulation.  Compliance with diet, lifestyle and medications: Yes  Several issues First he went ahead and had his kyphoplasty yesterday they held his anticoagulant appropriately he had no problem cage Second he is getting cardiology care at the [facility] he was found to have a 46 mm thoracic aortic aneurysm and he is being followed with serial imaging.  He has had EKGs at the [facility] and lab work is followed through them for kidney function and hemoglobin Third from a cardiology perspective he is doing well he is not having cardiovascular symptoms of palpitation shortness of breath chest pain or edema And EKG in our office with EP in June showing rate controlled atrial fibrillation incomplete right bundle branch block He is unaware of atrial fibrillation and tolerates his anticoagulant without bleeding. He is following his blood pressure with a wrist device he runs in the range of 110/70 heart rates as low as 55 to 80 bpm. I discussed with him optimal technique for blood pressure advised him to purchase a new validated upper arm device and he is hesitant. 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 Last Assessment & Plan: Atrophic nonunion left  humerus treated conservatively for the last 7 months will proceed with formal open reduction internal fixation with radial nerve exploration.  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   Edema, unspecified 02/12/2023   Esophageal reflux 06/30/2020   Essential hypertension 06/30/2020   Exposure to potentially hazardous substance 02/12/2023   Flatulence, eructation and gas pain 06/30/2020   Hammer toe of right foot 10/06/2014   Description:     Hip pain, right 01/19/2015   Description:   Description:   Description:     History of colonic polyps 06/30/2020   Hyperlipidemia 10/11/2022   Hypertensive heart disease 06/30/2020   Hypertrophic and atrophic condition of skin 06/30/2020   Imaging of gastrointestinal tract abnormal 06/30/2020   Incomplete tear of right rotator cuff 01/01/2018   Formatting of this note might be different from the original. Added automatically from request for surgery [id number] Last Assessment & Plan: Formatting of this note might be different from the original. Longstanding history of right shoulder pain refractory to conservative management.  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   Neck pain 06/30/2020   Other cervical disc degeneration,  unspecified cervical region 02/12/2023   Other fecal abnormalities 02/12/2023   Overweight 10/21/2015   Description:     Pain of left humerus 07/24/2015   Description:     Pain, foot, right, chronic 10/06/2014   Description:     Persistent atrial fibrillation (HCC) 01/08/2023   Posterior vitreous detachment 06/30/2020   PTSD (post-traumatic stress disorder) 10/11/2022   Right shoulder pain 03/12/2014   Description:     S/P right knee arthroscopy 07/11/2016   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   Sinus bradycardia 12/29/2016   Sprain of ribs, initial encounter 02/12/2023   Strain of muscle and tendon of back wall of thorax, initial encounter 02/12/2023   Superficial injury of cornea 06/30/2020   Thoracic aortic aneurysm without rupture (HCC) 11/06/2017   Tobacco abuse, 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      EKGs/Labs/Other Studies Reviewed:  The following studies were reviewed today:  Cardiac Studies & Procedures   ______________________________________________________________________________________________     ECHOCARDIOGRAM  ECHOCARDIOGRAM COMPLETE 12/04/2022  Narrative ECHOCARDIOGRAM REPORT    Patient Name:   MEILECH VIRTS Date of Exam: 12/04/2022 Medical Rec #:  [id number]       Height:       72.0 in Accession #:    [id number]      Weight:       244.8 lb Date of Birth:  [birth year]       BSA:          2.322 m Patient Age:    76 years        BP:           126/70 mmHg Patient Gender: M               HR:           71 bpm. Exam Location:  [facility]  Procedure: 2D Echo, Cardiac  Doppler and Color Doppler  Indications:    Hypertensive heart disease without heart failure [I11.9 (ICD-10-CM)]  History:        Patient has no prior history of Echocardiogram examinations. Arrythmias:Atrial fibrillation with RVR; Risk Factors:Dyslipidemia.  Sonographer:    Lynwood Silvas RDCS Referring Phys: [id number] REDELL JINNY LEITER   Sonographer Comments: Global longitudinal strain was attempted. IMPRESSIONS   1. Left ventricular ejection fraction, by estimation, is 60 to 65%. The left ventricle has normal function. The left ventricle has no regional wall motion abnormalities. 2. Right ventricular systolic function is normal. The right ventricular size is normal. There is normal pulmonary artery systolic pressure. 3. Left atrial size was moderately dilated. 4. The mitral valve is normal in structure. Mild mitral valve regurgitation. No evidence of mitral stenosis. 5. The aortic valve is normal in structure. Aortic valve regurgitation is not visualized. No aortic stenosis is present. 6. Aneurysm of the ascending aorta, measuring 43 mm. 7. The inferior vena cava is normal in size with greater than 50% respiratory variability, suggesting right atrial pressure of 3 mmHg.  FINDINGS Left Ventricle: Left ventricular ejection fraction, by estimation, is 60 to 65%. The left ventricle has normal function. The left ventricle has no regional wall motion abnormalities. The left ventricular internal cavity size was normal in size. There is no left ventricular hypertrophy. Left ventricular diastolic parameters are consistent with Grade III diastolic dysfunction (restrictive).  Right Ventricle: The right ventricular size is normal. No increase in right ventricular wall thickness. Right ventricular systolic function is normal. There is normal pulmonary artery systolic pressure. The tricuspid regurgitant velocity is 1.44 m/s, and with an assumed right atrial pressure of 8 mmHg, the estimated right ventricular  systolic pressure is 16.3 mmHg.  Left Atrium: Left atrial size was moderately dilated.  Right Atrium: Right atrial size was normal in size.  Pericardium: There is no evidence of pericardial effusion.  Mitral Valve: The mitral valve is normal in structure. Mild mitral valve regurgitation. No evidence of mitral valve stenosis.  Tricuspid Valve: The tricuspid valve is normal in structure. Tricuspid valve regurgitation is not demonstrated. No evidence of tricuspid stenosis.  Aortic Valve: The aortic valve is normal in structure. Aortic valve regurgitation is not visualized. No aortic stenosis is present.  Pulmonic Valve: The pulmonic valve was normal in structure. Pulmonic valve regurgitation is not visualized. No evidence of pulmonic stenosis.  Aorta: The aortic root is normal in size and structure. There is an aneurysm involving the ascending aorta  measuring 43 mm.  Venous: The inferior vena cava is normal in size with greater than 50% respiratory variability, suggesting right atrial pressure of 3 mmHg.  IAS/Shunts: No atrial level shunt detected by color flow Doppler.   LEFT VENTRICLE PLAX 2D LVIDd:         5.30 cm      Diastology LVIDs:         4.50 cm      LV e' medial:    7.83 cm/s LV PW:         1.10 cm      LV E/e' medial:  10.6 LV IVS:        1.10 cm      LV e' lateral:   12.70 cm/s LVOT diam:     2.20 cm      LV E/e' lateral: 6.6 LV SV:         62 LV SV Index:   27 LVOT Area:     3.80 cm  LV Volumes (MOD) LV vol d, MOD A2C: 129.0 ml LV vol d, MOD A4C: 109.0 ml LV vol s, MOD A2C: 62.3 ml LV vol s, MOD A4C: 47.1 ml LV SV MOD A2C:     66.7 ml LV SV MOD A4C:     109.0 ml LV SV MOD BP:      61.3 ml  RIGHT VENTRICLE            IVC RV Basal diam:  3.80 cm    IVC diam: 2.00 cm RV S prime:     9.36 cm/s TAPSE (M-mode): 2.3 cm  LEFT ATRIUM              Index        RIGHT ATRIUM           Index LA diam:        4.70 cm  2.02 cm/m   RA Area:     22.50 cm LA Vol (A2C):    100.0 ml 43.07 ml/m  RA Volume:   68.50 ml  29.50 ml/m LA Vol (A4C):   105.0 ml 45.23 ml/m LA Biplane Vol: 108.0 ml 46.52 ml/m AORTIC VALVE LVOT Vmax:   74.43 cm/s LVOT Vmean:  48.733 cm/s LVOT VTI:    0.163 m  AORTA Ao Root diam: 3.10 cm Ao Asc diam:  4.30 cm Ao Desc diam: 2.50 cm  MITRAL VALVE               TRICUSPID VALVE MV Area (PHT): 3.34 cm    TR Peak grad:   8.3 mmHg MV Decel Time: 227 msec    TR Vmax:        144.00 cm/s MV E velocity: 83.30 cm/s SHUNTS Systemic VTI:  0.16 m Systemic Diam: 2.20 cm  Jennifer Crape MD Electronically signed by Jennifer Crape MD Signature Date/Time: 12/04/2022/11:52:17 AM    Final          ______________________________________________________________________________________________          Recent Labs: No results found for requested labs within last 365 days.  Recent Lipid Panel No results found for: CHOL, TRIG, HDL, CHOLHDL, VLDL, LDLCALC, LDLDIRECT  Physical Exam:    VS:  There were no vitals taken for this visit.    Wt Readings from Last 3 Encounters:  01/07/24 224 lb 6.4 oz (101.8 kg)  02/14/23 234 lb 6.4 oz (106.3 kg)  01/08/23 240 lb (108.9 kg)     GEN:  Well nourished, well developed in  no acute distress HEENT: Normal NECK: No JVD; No carotid bruits LYMPHATICS: No lymphadenopathy CARDIAC: Irregular rate and rhythm  RESPIRATORY:  Clear to auscultation without rales, wheezing or rhonchi  ABDOMEN: Soft, non-tender, non-distended MUSCULOSKELETAL:  No edema; No deformity  SKIN: Warm and dry NEUROLOGIC:  Alert and oriented x 3 PSYCHIATRIC:  Normal affect    Signed, Redell Leiter, MD  04/14/2024 7:59 AM    [facility]

## 2024-04-16 DIAGNOSIS — K0402 Irreversible pulpitis: Secondary | ICD-10-CM | POA: Insufficient documentation

## 2024-04-16 DIAGNOSIS — R21 Rash and other nonspecific skin eruption: Secondary | ICD-10-CM | POA: Insufficient documentation

## 2024-04-16 DIAGNOSIS — F331 Major depressive disorder, recurrent, moderate: Secondary | ICD-10-CM | POA: Insufficient documentation

## 2024-04-16 DIAGNOSIS — I4821 Permanent atrial fibrillation: Secondary | ICD-10-CM | POA: Insufficient documentation

## 2024-04-16 DIAGNOSIS — L603 Nail dystrophy: Secondary | ICD-10-CM | POA: Insufficient documentation

## 2024-04-16 DIAGNOSIS — H90A32 Mixed conductive and sensorineural hearing loss, unilateral, left ear with restricted hearing on the contralateral side: Secondary | ICD-10-CM | POA: Insufficient documentation

## 2024-04-16 DIAGNOSIS — K08409 Partial loss of teeth, unspecified cause, unspecified class: Secondary | ICD-10-CM | POA: Insufficient documentation

## 2024-04-16 DIAGNOSIS — Z7189 Other specified counseling: Secondary | ICD-10-CM | POA: Insufficient documentation

## 2024-04-16 DIAGNOSIS — S32040A Wedge compression fracture of fourth lumbar vertebra, initial encounter for closed fracture: Secondary | ICD-10-CM | POA: Insufficient documentation

## 2024-04-16 DIAGNOSIS — S39012A Strain of muscle, fascia and tendon of lower back, initial encounter: Secondary | ICD-10-CM | POA: Insufficient documentation

## 2024-04-16 DIAGNOSIS — R0789 Other chest pain: Secondary | ICD-10-CM | POA: Insufficient documentation

## 2024-04-16 DIAGNOSIS — K08531 Fractured dental restorative material with loss of material: Secondary | ICD-10-CM | POA: Insufficient documentation

## 2024-04-16 DIAGNOSIS — R7303 Prediabetes: Secondary | ICD-10-CM | POA: Insufficient documentation

## 2024-04-16 DIAGNOSIS — M5416 Radiculopathy, lumbar region: Secondary | ICD-10-CM | POA: Insufficient documentation

## 2024-04-16 DIAGNOSIS — D485 Neoplasm of uncertain behavior of skin: Secondary | ICD-10-CM | POA: Insufficient documentation

## 2024-04-16 DIAGNOSIS — M5441 Lumbago with sciatica, right side: Secondary | ICD-10-CM | POA: Insufficient documentation

## 2024-04-16 DIAGNOSIS — Z461 Encounter for fitting and adjustment of hearing aid: Secondary | ICD-10-CM | POA: Insufficient documentation

## 2024-04-17 ENCOUNTER — Ambulatory Visit: Attending: Cardiology | Admitting: Cardiology

## 2024-04-17 ENCOUNTER — Encounter: Payer: Self-pay | Admitting: Cardiology

## 2024-04-17 VITALS — BP 138/70 | HR 73 | Ht 72.0 in | Wt 220.6 lb

## 2024-04-17 DIAGNOSIS — I7121 Aneurysm of the ascending aorta, without rupture: Secondary | ICD-10-CM

## 2024-04-17 DIAGNOSIS — Z01810 Encounter for preprocedural cardiovascular examination: Secondary | ICD-10-CM | POA: Diagnosis not present

## 2024-04-17 DIAGNOSIS — I4821 Permanent atrial fibrillation: Secondary | ICD-10-CM | POA: Diagnosis not present

## 2024-04-17 DIAGNOSIS — I119 Hypertensive heart disease without heart failure: Secondary | ICD-10-CM

## 2024-04-17 DIAGNOSIS — Z7901 Long term (current) use of anticoagulants: Secondary | ICD-10-CM

## 2024-04-17 NOTE — Patient Instructions (Signed)
 Medication Instructions:  Your physician recommends that you continue on your current medications as directed. Please refer to the Current Medication list given to you today.  *If you need a refill on your cardiac medications before your next appointment, please call your pharmacy*  Lab Work: None If you have labs (blood work) drawn today and your tests are completely normal, you will receive your results only by: MyChart Message (if you have MyChart) OR A paper copy in the mail If you have any lab test that is abnormal or we need to change your treatment, we will call you to review the results.  Testing/Procedures: None  Follow-Up: At [facility], you and your health needs are our priority.  As part of our continuing mission to provide you with exceptional heart care, our providers are all part of one team.  This team includes your primary Cardiologist (physician) and Advanced Practice Providers or APPs (Physician Assistants and Nurse Practitioners) who all work together to provide you with the care you need, when you need it.  Your next appointment:   1 year(s)  Provider:   Redell Leiter, MD    We recommend signing up for the patient portal called MyChart.  Sign up information is provided on this After Visit Summary.  MyChart is used to connect with patients for Virtual Visits (Telemedicine).  Patients are able to view lab/test results, encounter notes, upcoming appointments, etc.  Non-urgent messages can be sent to your provider as well.   To learn more about what you can do with MyChart, go to [email or web address].   Other Instructions Purchase an Omron blood pressure cuff device  Do not take Cipro type medications due to them increasing the occurrence of aneurysms.

## 2024-05-01 DIAGNOSIS — H35373 Puckering of macula, bilateral: Secondary | ICD-10-CM | POA: Diagnosis not present

## 2024-05-01 DIAGNOSIS — H3581 Retinal edema: Secondary | ICD-10-CM | POA: Diagnosis not present

## 2024-05-12 DIAGNOSIS — M545 Low back pain, unspecified: Secondary | ICD-10-CM | POA: Diagnosis not present

## 2024-05-12 DIAGNOSIS — S32030A Wedge compression fracture of third lumbar vertebra, initial encounter for closed fracture: Secondary | ICD-10-CM | POA: Diagnosis not present

## 2024-05-12 DIAGNOSIS — G8911 Acute pain due to trauma: Secondary | ICD-10-CM | POA: Diagnosis not present

## 2024-05-12 DIAGNOSIS — S32020A Wedge compression fracture of second lumbar vertebra, initial encounter for closed fracture: Secondary | ICD-10-CM | POA: Diagnosis not present

## 2024-05-14 DIAGNOSIS — S32020A Wedge compression fracture of second lumbar vertebra, initial encounter for closed fracture: Secondary | ICD-10-CM | POA: Diagnosis not present

## 2024-05-14 DIAGNOSIS — M4856XA Collapsed vertebra, not elsewhere classified, lumbar region, initial encounter for fracture: Secondary | ICD-10-CM | POA: Diagnosis not present

## 2024-05-21 DIAGNOSIS — E559 Vitamin D deficiency, unspecified: Secondary | ICD-10-CM | POA: Diagnosis not present

## 2024-05-21 DIAGNOSIS — S32020A Wedge compression fracture of second lumbar vertebra, initial encounter for closed fracture: Secondary | ICD-10-CM | POA: Diagnosis not present

## 2024-05-21 DIAGNOSIS — M5416 Radiculopathy, lumbar region: Secondary | ICD-10-CM | POA: Diagnosis not present

## 2024-05-21 DIAGNOSIS — M545 Low back pain, unspecified: Secondary | ICD-10-CM | POA: Diagnosis not present

## 2024-05-21 DIAGNOSIS — G8911 Acute pain due to trauma: Secondary | ICD-10-CM | POA: Diagnosis not present

## 2024-05-21 DIAGNOSIS — S32030A Wedge compression fracture of third lumbar vertebra, initial encounter for closed fracture: Secondary | ICD-10-CM | POA: Diagnosis not present

## 2024-07-02 ENCOUNTER — Telehealth: Payer: Self-pay

## 2024-07-02 NOTE — Telephone Encounter (Signed)
° °  Pre-operative Risk Assessment    Patient Name: Douglas Dudley  DOB: 1946/05/28 MRN: [id number]   Date of last office visit: 04/17/24 Date of next office visit: N/A   Request for Surgical Clearance    Procedure:  Lumbar kyphoplasty   Date of Surgery:  Clearance TBD                                Surgeon:  Dr. Debby Socks Group or Practice Name:  [facility] Phone number:  [phone] Fax number:  [phone]  or [phone] ATTN Nikki   Type of Clearance Requested:   - Pharmacy:  Hold Rivaroxaban (Xarelto) Please advise    Type of Anesthesia:  General    Additional requests/questions:    Bonney Calvert Pouch   07/02/2024, 11:01 AM

## 2024-07-08 NOTE — Telephone Encounter (Signed)
° °  Patient Name: Douglas Dudley  DOB: [birth year] MRN: [id number]  Primary Cardiologist: Redell Leiter, MD  Chart reviewed as part of pre-operative protocol coverage. Pre-op clearance already addressed by colleagues in earlier phone notes. To summarize recommendations:  -Per office protocol, patient can hold Xarelto for 3 days prior to procedure.  Please resume when medically safe to do so.  Medical clearance was not requested.  Will route this bundled recommendation to requesting provider via Epic fax function and remove from pre-op pool. Please call with questions.  Orren LOISE Fabry, PA-C 07/08/2024, 12:48 PM

## 2024-07-08 NOTE — Telephone Encounter (Signed)
 Patient with diagnosis of afib on Xarelto for anticoagulation.    Procedure: Lumbar kyphoplasty  Date of procedure: TBD   CHA2DS2-VASc Score = 4   This indicates a 4.8% annual risk of stroke. The patient's score is based upon: CHF History: 0 HTN History: 1 Diabetes History: 0 Stroke History: 0 Vascular Disease History: 1 Age Score: 2 Gender Score: 0      CrCl 128 ml/min Platelet count 139  Patient has not had an Afib/aflutter ablation in the last 3 months, DCCV within the last 4 weeks or a watchman implanted in the last 45 days   Per office protocol, patient can hold Xarelto for 3 days prior to procedure.    **This guidance is not considered finalized until pre-operative APP has relayed final recommendations.**
